# Patient Record
Sex: Female | Born: 1995 | Race: White | Hispanic: No | Marital: Single | Smoking: Former smoker
Health system: Southern US, Community
[De-identification: ages and names within clinical notes are randomized; demographics above are authoritative.]

## PROBLEM LIST (undated history)

## (undated) DIAGNOSIS — B178 Other specified acute viral hepatitis: Secondary | ICD-10-CM

## (undated) DIAGNOSIS — E559 Vitamin D deficiency, unspecified: Secondary | ICD-10-CM

## (undated) DIAGNOSIS — H18423 Band keratopathy, bilateral: Secondary | ICD-10-CM

## (undated) DIAGNOSIS — I10 Essential (primary) hypertension: Secondary | ICD-10-CM

## (undated) DIAGNOSIS — G43909 Migraine, unspecified, not intractable, without status migrainosus: Secondary | ICD-10-CM

## (undated) DIAGNOSIS — B2799 Infectious mononucleosis, unspecified with other complication: Secondary | ICD-10-CM

## (undated) DIAGNOSIS — B2790 Infectious mononucleosis, unspecified without complication: Secondary | ICD-10-CM

## (undated) DIAGNOSIS — A64 Unspecified sexually transmitted disease: Secondary | ICD-10-CM

## (undated) HISTORY — PX: WISDOM TOOTH EXTRACTION: SHX21

## (undated) HISTORY — DX: Band keratopathy, bilateral: H18.423

## (undated) HISTORY — DX: Infectious mononucleosis, unspecified with other complication: B27.99

## (undated) HISTORY — DX: Infectious mononucleosis, unspecified without complication: B27.90

## (undated) HISTORY — DX: Vitamin D deficiency, unspecified: E55.9

## (undated) HISTORY — DX: Migraine, unspecified, not intractable, without status migrainosus: G43.909

## (undated) HISTORY — DX: Other specified acute viral hepatitis: B17.8

## (undated) HISTORY — PX: OTHER SURGICAL HISTORY: SHX169

## (undated) HISTORY — DX: Unspecified sexually transmitted disease: A64

## (undated) HISTORY — PX: ANTERIOR CRUCIATE LIGAMENT REPAIR: SHX115

## (undated) HISTORY — DX: Other disorders of phosphorus metabolism: E83.39

---
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
## 2017-01-18 ENCOUNTER — Telehealth: Payer: Self-pay | Admitting: Certified Nurse Midwife

## 2017-01-18 NOTE — Telephone Encounter (Signed)
Not needed

## 2017-01-19 ENCOUNTER — Ambulatory Visit: Payer: BLUE CROSS/BLUE SHIELD | Admitting: Certified Nurse Midwife

## 2017-01-19 ENCOUNTER — Other Ambulatory Visit: Payer: Self-pay

## 2017-01-19 ENCOUNTER — Encounter: Payer: Self-pay | Admitting: Certified Nurse Midwife

## 2017-01-19 VITALS — BP 110/64 | HR 60 | Resp 16 | Ht 64.5 in | Wt 159.0 lb

## 2017-01-19 DIAGNOSIS — Z3009 Encounter for other general counseling and advice on contraception: Secondary | ICD-10-CM | POA: Diagnosis not present

## 2017-01-19 NOTE — Patient Instructions (Signed)

## 2017-01-19 NOTE — Progress Notes (Signed)
Subjective:     Patient ID: Renato GailsKelley A Rijos, female   DOB: Apr 25, 1995, 21 y.o.   MRN: [id number]  21 yo gopo here complaining of brown/red bleeding small to moderate amount with one tampon use. Used plan B Friday and started bleeding yesterday same amount. Here today to discuss contraception options. Has been using condoms for contraception and had unprotected intercourse , this was the reason for Plan B. Had IUD until 6/18 worked well and mother convinced her to, have removed due to mother's concern of how it worked. Patient's periods are regular, no issues and would like to have another IUD. Patient is in college in [place] and would like to have insertion prior to going back to school if possible. Patient does not feel as her mother does and dad agrees (per patient that IUD is OK to use). No partner change or STD concerns. Here for discussion only. No exam.     Review of Systems Pertinent to above     Objective:   Physical Exam Not done    Assessment:     Unprotected sexual activity with Plan B use with appropriate response per patient description. Contraception Kyleena IUD desired ( previous use with no issues)    Plan:     Discussed she can call for benefit information on insurance and will need to call when on period day 1-5 for insertion. She can have no unprotected intercourse prior to insertion. If concerns will not be inserted. If she does not have period needs to do UPT. If positive she needs to call. Will go ahead and have precerted if that is her plan. Patient wants to have this done. She will be called with information regarding. Questions addressed.  20 minutes in consult regarding IUD and Plan B use

## 2017-01-25 ENCOUNTER — Telehealth: Payer: Self-pay | Admitting: *Deleted

## 2017-01-25 DIAGNOSIS — Z30014 Encounter for initial prescription of intrauterine contraceptive device: Secondary | ICD-10-CM

## 2017-01-25 NOTE — Telephone Encounter (Signed)
Spoke with patient in regards to scheduling Rutha BouchardKyleena IUD insertion per Leota Sauerseborah Leonard, CNM request.   LMP 01/01/17. Took plan B on 01/13/17, has not been SA since taking Plan B. Returns back to college on 02/04/17.   Patient placed on brief hold to review scheduling with Dr. Oscar LaJertson -continue to abstain until IUD placement, UPT day of insertion, ok to schedule for 02/03/17.   Spoke with patient, IUD insertion scheduled for 02/03/17 at 9am with Dr. Oscar LaJertson. Advised as seen above. Advised to take Motrin 800 mg with food and water one hour before procedure. Will forward to our insurance and benefits department for review. Patient verbalizes understanding and is agreeable.   Routing to provider for final review. Patient is agreeable to disposition. Will close encounter.  Cc: Harland DingwallSuzy Dixon, Leota Sauerseborah Leonard, CNM

## 2017-01-31 HISTORY — PX: INTRAUTERINE DEVICE INSERTION: SHX323

## 2017-02-02 ENCOUNTER — Telehealth: Payer: Self-pay | Admitting: Obstetrics and Gynecology

## 2017-02-02 NOTE — Telephone Encounter (Signed)
Call placed to patient to review benefits for scheduled Kyleena IUD insertion.  Left voicemail message requesting a return call

## 2017-02-02 NOTE — Telephone Encounter (Signed)
Patient returned call. Patient advised of benefit information for appointment scheduled for 02/03/17, patient is agreeable. Ok to close

## 2017-02-03 ENCOUNTER — Ambulatory Visit (INDEPENDENT_AMBULATORY_CARE_PROVIDER_SITE_OTHER): Payer: BLUE CROSS/BLUE SHIELD | Admitting: Obstetrics and Gynecology

## 2017-02-03 ENCOUNTER — Ambulatory Visit: Payer: Self-pay

## 2017-02-03 ENCOUNTER — Encounter: Payer: Self-pay | Admitting: Obstetrics and Gynecology

## 2017-02-03 ENCOUNTER — Other Ambulatory Visit: Payer: Self-pay

## 2017-02-03 VITALS — BP 120/80 | HR 64 | Resp 16 | Ht 64.5 in | Wt 162.0 lb

## 2017-02-03 DIAGNOSIS — N914 Secondary oligomenorrhea: Secondary | ICD-10-CM | POA: Diagnosis not present

## 2017-02-03 DIAGNOSIS — Z30014 Encounter for initial prescription of intrauterine contraceptive device: Secondary | ICD-10-CM

## 2017-02-03 LAB — POCT URINE PREGNANCY: Preg Test, Ur: NEGATIVE

## 2017-02-03 NOTE — Progress Notes (Signed)
GYNECOLOGY  VISIT   HPI: 22 y.o.   Single  Caucasian  female   [id number] with Patient's last menstrual period was 01/01/2017 (exact date).   here for  kyleena iud insertion Irregular cycles lasting 5 days, moderated cramping, nausea & headaches. Uses tampon & maxi pads She had a liletta IUD removed in 6/18. Has been using condoms. Normal cycles from July until October, no cycle in November. LMP 01/01/17. Took plan b 12/13 or 12/14, had bleeding after that. Not sexually active since then.  She has had some hair loss, some decrease in appetite. Gained 3 lbs in the last few weeks. She has been under lots of stress particularly since the Fall.  No current sexual partner.   She is here with her mother.  GYNECOLOGIC HISTORY: Patient's last menstrual period was 01/01/2017 (exact date). Contraception: none. upt negative today Menopausal hormone therapy: none        OB History    Gravida Para Term Preterm AB Living   0 0 0 0 0 0   SAB TAB Ectopic Multiple Live Births   0 0 0 0           Patient Active Problem List   Diagnosis Date Noted  . Back strain 04/23/2015  . S/P ACL reconstruction 04/23/2015  . Non morbid obesity due to excess calories 03/27/2015  . ANA positive 01/19/2014  . Primary hypertension 11/20/2013  . Angioedema 11/18/2013  . Urticaria, chronic 11/18/2013  . DUB (dysfunctional uterine bleeding) 04/26/2012  . Hypophosphatemia   . Band keratopathy of both eyes   . Mononucleosis, infectious, with hepatitis   . Vitamin D deficiency disease   . 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 Band-shaped keratopathy 06/17/2010    Past Medical History:  Diagnosis Date  . Band keratopathy of both eyes   . Hypertension 11/20/2013  . Hypophosphatemia   . Migraines    with aura  . Mononucleosis   . Mononucleosis, infectious, with hepatitis    per patient no hepatitis  . Vitamin D deficiency disease     Past Surgical History:  Procedure Laterality Date  . ANTERIOR  CRUCIATE LIGAMENT REPAIR     2013 & 2015  . liletta iud     inserted 11-23-15  . OTHER SURGICAL HISTORY Right 2013   ACL reconstruction times 2  . WISDOM TOOTH EXTRACTION      Current Outpatient Medications  Medication Sig Dispense Refill  . acetaminophen (TYLENOL) 500 MG tablet Take 500-1,000 mg by mouth every 6 (six) hours as needed for mild pain.     . cetirizine (ZYRTEC) 10 MG tablet Take 10 mg by mouth 2 (two) times daily.     . Cholecalciferol (VITAMIN D PO) Take by mouth daily.    Marland Kitchen EPINEPHrine 0.3 mg/0.3 mL IJ SOAJ injection Inject 0.3 mLs (0.3 mg total) into the muscle once. 1 Device 0  . ibuprofen (ADVIL,MOTRIN) 800 MG tablet Take 800 mg by mouth every 8 (eight) hours as needed for moderate pain.   0  . ranitidine (ZANTAC) 150 MG tablet Take 150 mg by mouth 2 (two) times daily.  3   No current facility-administered medications for this visit.      ALLERGIES: Sulfa antibiotics and Dust mite extract  Family History  Problem Relation Age of Onset  . Diabetes Mother   . Obesity Mother   . Asthma Mother   . Thyroid disease Maternal Aunt   . Thyroid disease Maternal Grandmother   . Osteoporosis Maternal  Grandmother   . Asthma Maternal Uncle     Social History   Socioeconomic History  . Marital status: Single    Spouse name: Not on file  . Number of children: Not on file  . Years of education: Not on file  . Highest education level: Not on file  Social Needs  . Financial resource strain: Not on file  . Food insecurity - worry: Not on file  . Food insecurity - inability: Not on file  . Transportation needs - medical: Not on file  . Transportation needs - non-medical: Not on file  Occupational History  . Occupation: Student    Comment: 11th grade  Tobacco Use  . Smoking status: Never Smoker  . Smokeless tobacco: Never Used  Substance and Sexual Activity  . Alcohol use: No    Alcohol/week: 0.0 oz  . Drug use: No  . Sexual activity: Not Currently    Partners:  Male    Birth control/protection: None  Other Topics Concern  . Not on file  Social History Narrative   Lives with parents and brother. In 8th grade. Plays volleyball (in school and rec), in spanish club. Wants to go to college and be a child abuse counselor.    ROS  PHYSICAL EXAMINATION:    BP 120/80   Pulse 64   Resp 16   Ht 5' 4.5" (1.638 m)   Wt 162 lb (73.5 kg)   LMP 01/01/2017 (Exact Date)   BMI 27.38 kg/m     General appearance: alert, cooperative and appears stated age Neck: no adenopathy, supple, symmetrical, trachea midline and thyroid normal to inspection and palpation  Pelvic: External genitalia:  no lesions              Urethra:  normal appearing urethra with no masses, tenderness or lesions              Bartholins and Skenes: normal                 Vagina: normal appearing vagina with normal color and discharge, no lesions              Cervix: no cervical motion tenderness and no lesions              Bimanual Exam:  Uterus:  normal size, contour, position, consistency, mobility, non-tender and anteverted              Adnexa: no mass, fullness, tenderness                The risks of the kyleena IUD were reviewed with the patient, including infection, abnormal bleeding and uterine perfortion. Consent was signed.  A speculum was placed in the vagina, the cervix was cleansed with betadine. A tenaculum was placed on the cervix, the uterus sounded to 7 cm. The cervix was easily dilated to a 4 hagar dilator  The kyleena IUD was inserted without difficulty. The string were cut to 3-4 cm. The tenaculum was removed. Slight oozing from the tenaculum site was stopped with pressure.   The patient tolerated the procedure well.   Chaperone was present for exam.  ASSESSMENT Secondary oligomenorrhea, likely secondary to stress Contraception, kyleena IUD placed    PLAN UPT negative (not sexually active x 3 weeks) TSH, prolactin today Genprobe Use condoms for STD  protection    An After Visit Summary was printed and given to the patient.

## 2017-02-03 NOTE — Patient Instructions (Signed)
IUD Post-procedure Instructions . Cramping is common.  You may take Ibuprofen, Aleve, or Tylenol for the cramping.  This should resolve within 24 hours.   . You may have a small amount of spotting.  You should wear a mini pad for the next few days. . You may have intercourse in 24 hours. . You need to call the office if you have any pelvic pain, fever, heavy bleeding, or foul smelling vaginal discharge. . Shower or bathe as normal Must use back up contraception for one week

## 2017-02-04 LAB — GC/CHLAMYDIA PROBE AMP
Chlamydia trachomatis, NAA: NEGATIVE
NEISSERIA GONORRHOEAE BY PCR: NEGATIVE

## 2017-02-04 LAB — TSH: TSH: 2.99 u[IU]/mL (ref 0.450–4.500)

## 2017-02-04 LAB — PROLACTIN: Prolactin: 28.2 ng/mL — ABNORMAL HIGH (ref 4.8–23.3)

## 2017-02-06 ENCOUNTER — Telehealth: Payer: Self-pay | Admitting: *Deleted

## 2017-02-06 NOTE — Telephone Encounter (Signed)
-----   Message from Romualdo BolkJill Evelyn Jertson, MD sent at 02/06/2017  9:48 AM EST ----- The patient's prolactin level was minimally elevated. Please have her return this week for a repeat prolactin and a BhcG (She had a negative UPT at the time of her visit and denied sexual activity for 3 weeks). She should not have a big meal, sex or exercise prior to her repeat lab work.

## 2017-02-06 NOTE — Telephone Encounter (Signed)
Return call to Elaine. °

## 2017-02-06 NOTE — Telephone Encounter (Signed)
Patient called and scheduled her lab appointment in our office on Friday, 02/07/17, at 1:30 PM. Routing to [place] for [place].

## 2017-02-06 NOTE — Telephone Encounter (Signed)
Left message to call regarding lab results -eh 

## 2017-02-06 NOTE — Telephone Encounter (Signed)
Great - thanks

## 2017-02-06 NOTE — Telephone Encounter (Signed)
Spoke with patient and gave results. Patient is 3.5 hours away at college. She does not think she can come back anytime soon for the repeat labs. I asked patient if there was a [facility] near by and she did not know. Please advise -eh

## 2017-02-10 ENCOUNTER — Other Ambulatory Visit: Payer: Self-pay | Admitting: *Deleted

## 2017-02-10 ENCOUNTER — Encounter (HOSPITAL_BASED_OUTPATIENT_CLINIC_OR_DEPARTMENT_OTHER): Payer: Self-pay | Admitting: *Deleted

## 2017-02-10 ENCOUNTER — Emergency Department (HOSPITAL_BASED_OUTPATIENT_CLINIC_OR_DEPARTMENT_OTHER): Payer: BLUE CROSS/BLUE SHIELD

## 2017-02-10 ENCOUNTER — Emergency Department (HOSPITAL_BASED_OUTPATIENT_CLINIC_OR_DEPARTMENT_OTHER)
Admission: EM | Admit: 2017-02-10 | Discharge: 2017-02-10 | Disposition: A | Discharge status: Home / Self Care | Payer: BLUE CROSS/BLUE SHIELD | Attending: Emergency Medicine | Admitting: Emergency Medicine

## 2017-02-10 ENCOUNTER — Other Ambulatory Visit (INDEPENDENT_AMBULATORY_CARE_PROVIDER_SITE_OTHER): Payer: BLUE CROSS/BLUE SHIELD

## 2017-02-10 ENCOUNTER — Other Ambulatory Visit: Payer: Self-pay

## 2017-02-10 ENCOUNTER — Telehealth: Payer: Self-pay | Admitting: Obstetrics and Gynecology

## 2017-02-10 DIAGNOSIS — I10 Essential (primary) hypertension: Secondary | ICD-10-CM | POA: Insufficient documentation

## 2017-02-10 DIAGNOSIS — R102 Pelvic and perineal pain: Secondary | ICD-10-CM | POA: Insufficient documentation

## 2017-02-10 DIAGNOSIS — Z79899 Other long term (current) drug therapy: Secondary | ICD-10-CM | POA: Insufficient documentation

## 2017-02-10 DIAGNOSIS — R899 Unspecified abnormal finding in specimens from other organs, systems and tissues: Secondary | ICD-10-CM

## 2017-02-10 DIAGNOSIS — A64 Unspecified sexually transmitted disease: Secondary | ICD-10-CM

## 2017-02-10 HISTORY — DX: Unspecified sexually transmitted disease: A64

## 2017-02-10 LAB — WET PREP, GENITAL
Clue Cells Wet Prep HPF POC: NONE SEEN
Sperm: NONE SEEN
TRICH WET PREP: NONE SEEN
Yeast Wet Prep HPF POC: NONE SEEN

## 2017-02-10 LAB — HCG, QUANTITATIVE, PREGNANCY

## 2017-02-10 NOTE — ED Triage Notes (Signed)
She had an IUD put in on Friday. She had a blood test for BHCG today in the office because her prolactin levels were elevated on Friday and she was called to come back for further testing. He BHCG results are not back. She is having lower abdominal cramps.

## 2017-02-10 NOTE — Telephone Encounter (Signed)
Spoke with patient.  Patient states she started experiencing "pain in uterus" after lab appointment and wanted to get this checked before returning to school 3.5 hrs away. Advised patient would have been happy to discuss concerns and symptoms with provider while in office for lab appointment prior to leaving.   Patient states she was not aware she would not receive lab results until Mon or Tuesday until after she left the office today, felt ER would be appropriate for time purpose.  Advised patient labs will likely be drawn at ER, no changes made to labs drawn in office today, will update provider and return call with any additional recommendations. Return call to office with any additional questions/concerns. Patient is agreeable.    Routing to covering provider in office.   Cc: Dr. Oscar LaJertson

## 2017-02-10 NOTE — Discharge Instructions (Signed)
Your ultrasound was normal with IUD in correct position. Your pregnancy test is negative. Please follow up with your OB/GYN as needed. Take tylenol/motrin for cramping.

## 2017-02-10 NOTE — ED Provider Notes (Signed)
[facility] EMERGENCY DEPARTMENT Provider Note   CSN: [id number] Arrival date & time: 02/10/17  1451     History   Chief Complaint Chief Complaint  Patient presents with  . Abdominal Pain    HPI Katie Alvarez is a 22 y.o. female.  HPI Katie Alvarez is a 22 y.o. female presents to ED with complaint of pelvic pain. Pt states she had an IUD placed 1 week ago. States had initially some spotting and cramping which resolved.  She states today she started having sudden onset of severe cramping.  She denies any bleeding.  No nausea or vomiting.  No urinary symptoms.  She called her OB/GYN who told her to come back for some blood work, because her prolactin levels were minimally elevated on prior evaluation.  She did have negative urine hCG.  She was told to come back for recheck of prolactin levels and repeat hCG.  Patient did have this blood work done today, however results are not back yet.  Patient decided to come to emergency department. Pt states pain is "crampy." states nothing making it better or worse. No other complaints.   Past Medical History:  Diagnosis Date  . Band keratopathy of both eyes   . Hypertension 11/20/2013  . Hypophosphatemia   . Migraines    with aura  . Mononucleosis   . Mononucleosis, infectious, with hepatitis    per patient no hepatitis  . Vitamin D deficiency disease     Patient Active Problem List   Diagnosis Date Noted  . Back strain 04/23/2015  . S/P ACL reconstruction 04/23/2015  . Non morbid obesity due to excess calories 03/27/2015  . ANA positive 01/19/2014  . Primary hypertension 11/20/2013  . Angioedema 11/18/2013  . Urticaria, chronic 11/18/2013  . DUB (dysfunctional uterine bleeding) 04/26/2012  . Hypophosphatemia   . Band keratopathy of both eyes   . Mononucleosis, infectious, with hepatitis   . Vitamin D deficiency disease   . 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 Band-shaped keratopathy 06/17/2010    Past  Surgical History:  Procedure Laterality Date  . ANTERIOR CRUCIATE LIGAMENT REPAIR     2013 & 2015  . liletta iud     inserted 11-23-15  . OTHER SURGICAL HISTORY Right 2013   ACL reconstruction times 2  . WISDOM TOOTH EXTRACTION      OB History    Gravida Para Term Preterm AB Living   0 0 0 0 0 0   SAB TAB Ectopic Multiple Live Births   0 0 0 0         Home Medications    Prior to Admission medications   Medication Sig Start Date End Date Taking? Authorizing Provider  acetaminophen (TYLENOL) 500 MG tablet Take 500-1,000 mg by mouth every 6 (six) hours as needed for mild pain.     [provider]  cetirizine (ZYRTEC) 10 MG tablet Take 10 mg by mouth 2 (two) times daily.     [provider]  Cholecalciferol (VITAMIN D PO) Take by mouth daily.    [provider]  EPINEPHrine 0.3 mg/0.3 mL IJ SOAJ injection Inject 0.3 mLs (0.3 mg total) into the muscle once. 11/21/13   Rockney Ghee, MD  ibuprofen (ADVIL,MOTRIN) 800 MG tablet Take 800 mg by mouth every 8 (eight) hours as needed for moderate pain.  12/18/13   [provider]  ranitidine (ZANTAC) 150 MG tablet Take 150 mg by mouth 2 (two) times daily. 04/03/15  [provider]    Family History Family History  Problem Relation Age of Onset  . Diabetes Mother   . Obesity Mother   . Asthma Mother   . Thyroid disease Maternal Aunt   . Thyroid disease Maternal Grandmother   . Osteoporosis Maternal Grandmother   . Asthma Maternal Uncle     Social History Social History   Tobacco Use  . Smoking status: Never Smoker  . Smokeless tobacco: Never Used  Substance Use Topics  . Alcohol use: No    Alcohol/week: 0.0 oz  . Drug use: No     Allergies   Sulfa antibiotics and Dust mite extract   Review of Systems Review of Systems  Constitutional: Negative for chills and fever.  Respiratory: Negative for cough, chest tightness and shortness of breath.   Cardiovascular: Negative  for chest pain, palpitations and leg swelling.  Gastrointestinal: Positive for abdominal pain. Negative for diarrhea, nausea and vomiting.  Genitourinary: Positive for pelvic pain. Negative for dysuria, flank pain, vaginal bleeding, vaginal discharge and vaginal pain.  Musculoskeletal: Negative for arthralgias, myalgias, neck pain and neck stiffness.  Skin: Negative for rash.  Neurological: Negative for dizziness, weakness and headaches.  All other systems reviewed and are negative.    Physical Exam Updated Vital Signs BP (!) 129/91   Pulse 87   Temp 98.2 F (36.8 C) (Oral)   Resp 18   Ht 5' 4.5" (1.638 m)   Wt 73.5 kg (162 lb)   SpO2 100%   BMI 27.38 kg/m   Physical Exam  Constitutional: She appears well-developed and well-nourished. No distress.  HENT:  Head: Normocephalic.  Eyes: Conjunctivae are normal.  Neck: Neck supple.  Cardiovascular: Normal rate, regular rhythm and normal heart sounds.  Pulmonary/Chest: Effort normal and breath sounds normal. No respiratory distress. She has no wheezes. She has no rales.  Abdominal: Soft. Bowel sounds are normal. She exhibits no distension. There is tenderness in the suprapubic area. There is no rebound.  Genitourinary:  Genitourinary Comments: Normal external genitalia.  Cervix is closed, brown cervical discharge present.  Strings are visualized.  No cervical motion tenderness.  Mild uterine tenderness. No adnexal tenderness.  Musculoskeletal: She exhibits no edema.  Neurological: She is alert.  Skin: Skin is warm and dry.  Psychiatric: She has a normal mood and affect. Her behavior is normal.  Nursing note and vitals reviewed.    ED Treatments / Results  Labs (all labs ordered are listed, but only abnormal results are displayed) Labs Reviewed  WET PREP, GENITAL - Abnormal; Notable for the following components:      Result Value   WBC, Wet Prep HPF POC MANY (*)    All other components within normal limits  HCG,  QUANTITATIVE, PREGNANCY  GC/CHLAMYDIA PROBE AMP ([place]) NOT AT [facility]    EKG  EKG Interpretation None       Radiology [place] Pelvis Transvanginal Non-ob (tv Only)  Result Date: 02/10/2017 CLINICAL DATA:  Elevated prolactin, midpelvic pain today. Patient had IUD placed 1 week ago. EXAM: TRANSABDOMINAL AND TRANSVAGINAL ULTRASOUND OF PELVIS TECHNIQUE: Both transabdominal and transvaginal ultrasound examinations of the pelvis were performed. Transabdominal technique was performed for global imaging of the pelvis including uterus, ovaries, adnexal regions, and pelvic cul-de-sac. It was necessary to proceed with endovaginal exam following the transabdominal exam to visualize the uterus and ovaries. COMPARISON:  None FINDINGS: Uterus Measurements: 7.2 x 3.3 x 4.2 cm. No fibroids or other mass visualized. The both the and cyst  is identified in the cervix. Endometrium Thickness: 7 mm. IUD is identified in good position. No focal abnormality visualized. Right ovary Measurements: 3.2 x 2.2 x 2.6 cm. Normal appearance. Small follicles are noted. Left ovary Measurements: 4.3 x 2.6 x 2.3 cm. Normal appearance. Small follicles are noted. Other findings Small amount of free fluid is identified. IMPRESSION: IUD in good position.  No acute abnormality noted. Small free fluid identified in the pelvis, likely physiologic. Electronically Signed   By: Sherian Rein M.D.   On: 02/10/2017 18:40   US Pelvis Complete  Result Date: 02/10/2017 CLINICAL DATA:  Elevated prolactin, midpelvic pain today. Patient had IUD placed 1 week ago. EXAM: TRANSABDOMINAL AND TRANSVAGINAL ULTRASOUND OF PELVIS TECHNIQUE: Both transabdominal and transvaginal ultrasound examinations of the pelvis were performed. Transabdominal technique was performed for global imaging of the pelvis including uterus, ovaries, adnexal regions, and pelvic cul-de-sac. It was necessary to proceed with endovaginal exam following the transabdominal exam to visualize  the uterus and ovaries. COMPARISON:  None FINDINGS: Uterus Measurements: 7.2 x 3.3 x 4.2 cm. No fibroids or other mass visualized. The both the and cyst is identified in the cervix. Endometrium Thickness: 7 mm. IUD is identified in good position. No focal abnormality visualized. Right ovary Measurements: 3.2 x 2.2 x 2.6 cm. Normal appearance. Small follicles are noted. Left ovary Measurements: 4.3 x 2.6 x 2.3 cm. Normal appearance. Small follicles are noted. Other findings Small amount of free fluid is identified. IMPRESSION: IUD in good position.  No acute abnormality noted. Small free fluid identified in the pelvis, likely physiologic. Electronically Signed   By: Sherian Rein M.D.   On: 02/10/2017 18:40    Procedures Procedures (including critical care time)  Medications Ordered in ED Medications - No data to display   Initial Impression / Assessment and Plan / ED Course  I have reviewed the triage vital signs and the nursing notes.  Pertinent labs & imaging results that were available during my care of the patient were reviewed by me and considered in my medical decision making (see chart for details).     Pt in ED with pelvic pain. IUD placed 1 week ago. Elevated prolactin level at the time. Pt called OB./GYN, who requested preg test recheck. Will get hcg, will perform pelvic exam and [place] to verify correct placement.   HCG is negative.  Pelvic ultrasound unremarkable with normal placement of IUD.  Patient states she is currently pain-free.  At this time stable for discharge home with close outpatient follow-up.  Patient feels reassured and comfortable going home.  Vitals:   02/10/17 1500 02/10/17 1502 02/10/17 1717 02/10/17 1911  BP:  (!) 129/91 140/79   Pulse:  87 62 (!) 58  Resp:  18 18 16   Temp:  98.2 F (36.8 C)  98.5 F (36.9 C)  TempSrc:  Oral  Oral  SpO2:  100% 100% 98%  Weight: 73.5 kg (162 lb)     Height: 5' 4.5" (1.638 m)        Final Clinical Impressions(s) / ED  Diagnoses   Final diagnoses:  Pelvic pain  Pelvic cramping    ED Discharge Orders    None       Jaynie Crumble, PA-C 02/10/17 2304    Benjiman Core, MD 02/10/17 2344

## 2017-02-10 NOTE — ED Notes (Signed)
Patient is scared that she might be pregnant before she had the IUD.

## 2017-02-10 NOTE — Telephone Encounter (Signed)
Patient was seen today for labs. Patient is concerned about the possibly of being pregnant with an IUD. Patient is asking if the labs could be expedited due to needed her IUD removed if she is pregnant.

## 2017-02-10 NOTE — Telephone Encounter (Signed)
Upon chart review for patient phone call. Patient is now in ED at [facility] for abdominal cramps.  Adjusting HCG to stat order deferred. Will leave as routine.

## 2017-02-11 LAB — PROLACTIN: Prolactin: 16 ng/mL (ref 4.8–23.3)

## 2017-02-11 LAB — BETA HCG QUANT (REF LAB): hCG Quant: <1 m[IU]/mL

## 2017-02-11 LAB — GC/CHLAMYDIA PROBE AMP (~~LOC~~) NOT AT ARMC
CHLAMYDIA, DNA PROBE: POSITIVE — AB
NEISSERIA GONORRHEA: NEGATIVE

## 2017-02-13 ENCOUNTER — Other Ambulatory Visit: Payer: Self-pay

## 2017-02-13 ENCOUNTER — Ambulatory Visit (INDEPENDENT_AMBULATORY_CARE_PROVIDER_SITE_OTHER): Payer: BLUE CROSS/BLUE SHIELD | Admitting: Obstetrics and Gynecology

## 2017-02-13 ENCOUNTER — Encounter: Payer: Self-pay | Admitting: Obstetrics and Gynecology

## 2017-02-13 ENCOUNTER — Telehealth: Payer: Self-pay | Admitting: Certified Nurse Midwife

## 2017-02-13 ENCOUNTER — Telehealth: Payer: Self-pay | Admitting: *Deleted

## 2017-02-13 VITALS — BP 118/80 | HR 80 | Temp 98.2°F | Resp 16 | Wt 165.0 lb

## 2017-02-13 DIAGNOSIS — N730 Acute parametritis and pelvic cellulitis: Secondary | ICD-10-CM

## 2017-02-13 DIAGNOSIS — R102 Pelvic and perineal pain: Secondary | ICD-10-CM | POA: Diagnosis not present

## 2017-02-13 DIAGNOSIS — N898 Other specified noninflammatory disorders of vagina: Secondary | ICD-10-CM

## 2017-02-13 DIAGNOSIS — R112 Nausea with vomiting, unspecified: Secondary | ICD-10-CM

## 2017-02-13 LAB — POCT URINE PREGNANCY: PREG TEST UR: NEGATIVE

## 2017-02-13 MED ORDER — METRONIDAZOLE 500 MG PO TABS: 500.0000 mg | ORAL_TABLET | Freq: Two times a day (BID) | ORAL | 0 refills | Status: DC

## 2017-02-13 MED ORDER — CEFTRIAXONE SODIUM 250 MG IJ SOLR
250.0000 mg | Freq: Once | INTRAMUSCULAR | Status: AC
  Administered 2017-02-13: 250 mg via INTRAMUSCULAR

## 2017-02-13 MED ORDER — DOXYCYCLINE HYCLATE 100 MG PO CAPS: 100.0000 mg | ORAL_CAPSULE | Freq: Two times a day (BID) | ORAL | 0 refills | Status: DC

## 2017-02-13 MED ORDER — AZITHROMYCIN 1 G PO PACK: 1.0000 | PACK | Freq: Once | ORAL | 0 refills | Status: AC

## 2017-02-13 MED ORDER — ONDANSETRON HCL 4 MG PO TABS: 4.0000 mg | ORAL_TABLET | Freq: Three times a day (TID) | ORAL | 1 refills | Status: DC | PRN

## 2017-02-13 MED ORDER — IBUPROFEN 800 MG PO TABS: 800.0000 mg | ORAL_TABLET | Freq: Three times a day (TID) | ORAL | 1 refills | Status: DC | PRN

## 2017-02-13 NOTE — Telephone Encounter (Signed)
Spoke with patient and advised that we also sent in an RX for Flagyl. Advised to pick up Flagyl and doxycycline at [facility]. Call to [facility] to cancel Z[facility]romax. -eh

## 2017-02-13 NOTE — Telephone Encounter (Signed)
Spoke with patient, advised as seen below per Dr. Oscar LaJertson. OV scheduled for today at 4:15pm. Patient verbalizes understanding and is agreeable.   Routing to provider for final review. Patient is agreeable to disposition. Will close encounter.

## 2017-02-13 NOTE — Progress Notes (Signed)
GYNECOLOGY  VISIT   HPI: 22 y.o.   Single  Caucasian  female   G0P0000 with No LMP recorded. Patient is not currently having periods (Reason: IUD).   here c/o pelvic pain, vaginal discharge and odor X 3 days   The patient had a kyleena IUD placed on 02/03/17. She had a negative genprobe at that visit. She was then seen in the ER on 02/10/17 for pain and had a + chlamydia test, negative BhcG, negative affirm and an ultrasound that showed the IUD was in place and normal adnexa bilaterally.  In the ER she didn't have CMT, uterus was mildly tender, no adnexal tenderness.  She was having intermittent pelvic pain since the IUD was inserted. Today the pain has been constant. Up to an 8/10 in severity. The constant pain is sharp, then she is having intermittent cramping. She is having an increase in yellow/blood tinged d/c.  No fever, some nausea, had emesis earlier today, thinks it may have been from the pain. . No bowel changes.   GYNECOLOGIC HISTORY: No LMP recorded. Patient is not currently having periods (Reason: IUD). Contraception:IUD Menopausal hormone therapy: none         OB History    Gravida Para Term Preterm AB Living   0 0 0 0 0 0   SAB TAB Ectopic Multiple Live Births   0 0 0 0           Patient Active Problem List   Diagnosis Date Noted  . Back strain 04/23/2015  . S/P ACL reconstruction 04/23/2015  . Non morbid obesity due to excess calories 03/27/2015  . ANA positive 01/19/2014  . Primary hypertension 11/20/2013  . Angioedema 11/18/2013  . Urticaria, chronic 11/18/2013  . DUB (dysfunctional uterine bleeding) 04/26/2012  . Hypophosphatemia   . Band keratopathy of both eyes   . Mononucleosis, infectious, with hepatitis   . Vitamin D deficiency disease   . 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 Band-shaped keratopathy 06/17/2010    Past Medical History:  Diagnosis Date  . Band keratopathy of both eyes   . Hypertension 11/20/2013  . Hypophosphatemia   .  Migraines    with aura  . Mononucleosis   . Mononucleosis, infectious, with hepatitis    per patient no hepatitis  . STD (sexually transmitted disease) 02/10/2017   chlamydia   . Vitamin D deficiency disease     Past Surgical History:  Procedure Laterality Date  . ANTERIOR CRUCIATE LIGAMENT REPAIR     2013 & 2015  . liletta iud     inserted 11-23-15  . OTHER SURGICAL HISTORY Right 2013   ACL reconstruction times 2  . WISDOM TOOTH EXTRACTION      Current Outpatient Medications  Medication Sig Dispense Refill  . acetaminophen (TYLENOL) 500 MG tablet Take 500-1,000 mg by mouth every 6 (six) hours as needed for mild pain.     . cetirizine (ZYRTEC) 10 MG tablet Take 10 mg by mouth 2 (two) times daily.     . Cholecalciferol (VITAMIN D PO) Take by mouth daily.    Marland Kitchen. EPINEPHrine 0.3 mg/0.3 mL IJ SOAJ injection Inject 0.3 mLs (0.3 mg total) into the muscle once. 1 Device 0  . ibuprofen (ADVIL,MOTRIN) 800 MG tablet Take 800 mg by mouth every 8 (eight) hours as needed for moderate pain.   0  . ranitidine (ZANTAC) 150 MG tablet Take 150 mg by mouth 2 (two) times daily.  3  . azithromycin (ZITHROMAX) 1 g  powder Take 1 packet by mouth once for 1 dose. (Patient not taking: Reported on 02/13/2017) 1 packet 0   No current facility-administered medications for this visit.      ALLERGIES: Sulfa antibiotics and Dust mite extract  Family History  Problem Relation Age of Onset  . Diabetes Mother   . Obesity Mother   . Asthma Mother   . Thyroid disease Maternal Aunt   . Thyroid disease Maternal Grandmother   . Osteoporosis Maternal Grandmother   . Asthma Maternal Uncle     Social History   Socioeconomic History  . Marital status: Single    Spouse name: Not on file  . Number of children: Not on file  . Years of education: Not on file  . Highest education level: Not on file  Social Needs  . Financial resource strain: Not on file  . Food insecurity - worry: Not on file  . Food  insecurity - inability: Not on file  . Transportation needs - medical: Not on file  . Transportation needs - non-medical: Not on file  Occupational History  . Occupation: Student    Comment: 11th grade  Tobacco Use  . Smoking status: Never Smoker  . Smokeless tobacco: Never Used  Substance and Sexual Activity  . Alcohol use: No    Alcohol/week: 0.0 oz  . Drug use: No  . Sexual activity: Not Currently    Partners: Male    Birth control/protection: None  Other Topics Concern  . Not on file  Social History Narrative   Lives with parents and brother. In 8th grade. Plays volleyball (in school and rec), in spanish club. Wants to go to college and be a child abuse counselor.    Review of Systems  Constitutional: Negative.   HENT: Negative.   Eyes: Negative.   Respiratory: Negative.   Cardiovascular: Negative.   Gastrointestinal: Positive for nausea and vomiting.  Genitourinary:       Pelvic pain Vaginal discharge and odor   Musculoskeletal: Negative.   Skin: Negative.   Neurological: Negative.   Endo/Heme/Allergies: Negative.   Psychiatric/Behavioral: Negative.     PHYSICAL EXAMINATION:    BP 118/80 (BP Location: Right Arm, Patient Position: Sitting, Cuff Size: Normal)   Pulse 80   Resp 16   Wt 165 lb (74.8 kg)   BMI 27.88 kg/m     General appearance: alert, cooperative and appears stated age Neck: no adenopathy, supple, symmetrical, trachea midline and thyroid normal to inspection and palpation Heart: regular rate and rhythm Lungs: CTAB Abdomen: soft, non-tender; bowel sounds normal; no masses,  no organomegaly Extremities: normal, atraumatic, no cyanosis Skin: normal color, texture and turgor, no rashes or lesions Neurologic: grossly normal   Pelvic: External genitalia:  no lesions              Urethra:  normal appearing urethra with no masses, tenderness or lesions              Bartholins and Skenes: normal                 Vagina: normal appearing vagina with  normal color and discharge, no lesions              Cervix: no lesions and IUD string 4 cm. +CMT              Bimanual Exam:  Uterus:  tender, mobile, normal sized              Adnexa: tender bilaterally,  no masses               Chaperone was present for exam.  ASSESSMENT PID + chlamydia from last week (negative chlamydia the day of IUD insertion) Has an IUD, just placed on 02/03/17, in place on ultrasound on 02/10/17. Guidelines do not require removing the IUD as long as she is clinically improving.  Nausea, episode of emesis earlier today. She thinks it is from the pain.     PLAN Ceftriaxone 250 mg IM Doxycycline 100 mg BID x 2 weeks Because she had uterine instrumentation in the last 2 weeks need to add flagyl to the above antibiotics Zofran for nausea as needed Ibuprofen and tylenol for pain Discussed that if her symptoms and exam improve she doesn't need the IUD removed F/U tomorrow, may need another ultrasound Discussed that if she develops a fever or can't keep the medicine down she will need to be hospitalized   An After Visit Summary was printed and given to the patient.

## 2017-02-13 NOTE — Telephone Encounter (Signed)
Spoke with patient, advised as seen below per Dr. Oscar LaJertson. Patient declined EPT. Rx to [facility].   Patient reports yellow vaginal d/c with odor started on 1/12. Reports pelvic cramping has continued. Reviewed symptoms of chlamydia. Advised should symptoms not resolve or worsen, return call to office or seek care at local ER/Urgent care.   Denies fever/chills, N/V.   Patient declined to schedule 3 mo TOC d/t school schedule, will return call.   Advised patient Dr. Oscar LaJertson will review, I will return call with any additional recommendations. Patient is agreeable.   Routing to Dr. Oscar LaJertson.   Communicable Disease [facility], ATT: Brandy Sessoms at [phone].

## 2017-02-13 NOTE — Telephone Encounter (Signed)
I reviewed the report, she was + for chlamydia, not GC. Please treat with azithromycin 1 gram po. She needs f/u testing in 3 months. Partner needs to be treated, please offer expedited partner therapy. She should use condoms every time she is sexually active and shouldn't be sexually active until one week after she and her partner have been treated.

## 2017-02-13 NOTE — Telephone Encounter (Signed)
Patient was seen at [facility] on 02/10/2017 for pelvic pain and cramping. Patient was tested for GC/Chl and GC testing returned positive. Has not been contacted or treated for this. Saw results through MyChart. Patient had an IUD placed on 02/03/2017 in our office.   Please advise.

## 2017-02-13 NOTE — Telephone Encounter (Signed)
Please have her come in for evaluation if not here, then she needs to be seen by MD at school.

## 2017-02-13 NOTE — Telephone Encounter (Signed)
Patient was seen at [facility] over the weekend for pelvic pain.  They did STD testing and she states she checked her MyChart today and her chlamydia test came back positive.  States she has not been treated and wants to know what she needs to do now.

## 2017-02-13 NOTE — Telephone Encounter (Signed)
Patient called states she goes back to college tomorrow and wants to know what can be done.

## 2017-02-14 ENCOUNTER — Encounter (HOSPITAL_COMMUNITY): Payer: Self-pay

## 2017-02-14 ENCOUNTER — Other Ambulatory Visit: Payer: Self-pay | Admitting: *Deleted

## 2017-02-14 ENCOUNTER — Ambulatory Visit: Payer: BLUE CROSS/BLUE SHIELD | Admitting: Obstetrics and Gynecology

## 2017-02-14 ENCOUNTER — Encounter: Payer: Self-pay | Admitting: Obstetrics and Gynecology

## 2017-02-14 ENCOUNTER — Ambulatory Visit (INDEPENDENT_AMBULATORY_CARE_PROVIDER_SITE_OTHER): Payer: BLUE CROSS/BLUE SHIELD

## 2017-02-14 ENCOUNTER — Other Ambulatory Visit: Payer: Self-pay

## 2017-02-14 ENCOUNTER — Inpatient Hospital Stay (HOSPITAL_COMMUNITY)
Admission: AD | Admit: 2017-02-14 | Discharge: 2017-02-15 | DRG: 759 | Disposition: A | Discharge status: Home / Self Care | Payer: BLUE CROSS/BLUE SHIELD | Source: Ambulatory Visit | Attending: Obstetrics and Gynecology | Admitting: Obstetrics and Gynecology

## 2017-02-14 VITALS — BP 138/92 | HR 76 | Resp 16 | Wt 165.0 lb

## 2017-02-14 DIAGNOSIS — R112 Nausea with vomiting, unspecified: Secondary | ICD-10-CM

## 2017-02-14 DIAGNOSIS — N739 Female pelvic inflammatory disease, unspecified: Secondary | ICD-10-CM

## 2017-02-14 DIAGNOSIS — N730 Acute parametritis and pelvic cellulitis: Secondary | ICD-10-CM

## 2017-02-14 DIAGNOSIS — A749 Chlamydial infection, unspecified: Secondary | ICD-10-CM

## 2017-02-14 DIAGNOSIS — R102 Pelvic and perineal pain: Secondary | ICD-10-CM

## 2017-02-14 DIAGNOSIS — A5611 Chlamydial female pelvic inflammatory disease: Secondary | ICD-10-CM | POA: Diagnosis present

## 2017-02-14 DIAGNOSIS — Z23 Encounter for immunization: Secondary | ICD-10-CM | POA: Diagnosis not present

## 2017-02-14 DIAGNOSIS — Z975 Presence of (intrauterine) contraceptive device: Secondary | ICD-10-CM | POA: Diagnosis not present

## 2017-02-14 LAB — CBC WITH DIFFERENTIAL/PLATELET
BASOS ABS: 0 10*3/uL (ref 0.0–0.1)
BASOS: 0 %
Basophils Absolute: 0 10*3/uL (ref 0.0–0.2)
Basophils Relative: 0 %
EOS (ABSOLUTE): 0.1 10*3/uL (ref 0.0–0.4)
EOS ABS: 0.1 10*3/uL (ref 0.0–0.7)
EOS PCT: 1 %
EOS: 1 %
HCT: 37.4 % (ref 36.0–46.0)
HEMATOCRIT: 42.6 % (ref 34.0–46.6)
HEMOGLOBIN: 14.3 g/dL (ref 11.1–15.9)
Hemoglobin: 12.8 g/dL (ref 12.0–15.0)
Immature Grans (Abs): 0 10*3/uL (ref 0.0–0.1)
Immature Granulocytes: 0 %
LYMPHS ABS: 2.6 10*3/uL (ref 0.7–3.1)
LYMPHS PCT: 28 %
Lymphs Abs: 2.6 10*3/uL (ref 0.7–4.0)
Lymphs: 27 %
MCH: 30 pg (ref 26.6–33.0)
MCH: 30.2 pg (ref 26.0–34.0)
MCHC: 33.6 g/dL (ref 31.5–35.7)
MCHC: 34.2 g/dL (ref 30.0–36.0)
MCV: 90 fL (ref 79–97)
MCV: 88.2 fL (ref 78.0–100.0)
MONO ABS: 0.4 10*3/uL (ref 0.1–1.0)
MONOCYTES: 8 %
Monocytes Absolute: 0.8 10*3/uL (ref 0.1–0.9)
Monocytes Relative: 4 %
NEUTROS ABS: 6.4 10*3/uL (ref 1.4–7.0)
Neutro Abs: 6.4 10*3/uL (ref 1.7–7.7)
Neutrophils Relative %: 67 %
Neutrophils: 64 %
PLATELETS: 333 10*3/uL (ref 150–400)
Platelets: 425 10*3/uL — ABNORMAL HIGH (ref 150–379)
RBC: 4.76 x10E6/uL (ref 3.77–5.28)
RBC: 4.24 MIL/uL (ref 3.87–5.11)
RDW: 14.1 % (ref 12.3–15.4)
RDW: 13.6 % (ref 11.5–15.5)
WBC: 9.9 10*3/uL (ref 3.4–10.8)
WBC: 9.5 10*3/uL (ref 4.0–10.5)

## 2017-02-14 LAB — VAGINITIS/VAGINOSIS, DNA PROBE
Candida Species: NEGATIVE
Gardnerella vaginalis: NEGATIVE
TRICHOMONAS VAG: NEGATIVE

## 2017-02-14 MED ORDER — ONDANSETRON HCL 4 MG PO TABS: 4.0000 mg | ORAL_TABLET | Freq: Four times a day (QID) | ORAL | Status: DC | PRN

## 2017-02-14 MED ORDER — ONDANSETRON HCL 4 MG/2ML IJ SOLN
4.0000 mg | Freq: Four times a day (QID) | INTRAMUSCULAR | Status: DC | PRN
  Administered 2017-02-14 – 2017-02-15 (×3): 4 mg via INTRAVENOUS
  Filled 2017-02-14 (×3): qty 2

## 2017-02-14 MED ORDER — LACTATED RINGERS IV SOLN
INTRAVENOUS | Status: DC
  Administered 2017-02-14: 17:00:00 via INTRAVENOUS

## 2017-02-14 MED ORDER — DOXYCYCLINE HYCLATE 100 MG IV SOLR
100.0000 mg | Freq: Two times a day (BID) | INTRAVENOUS | Status: DC
  Administered 2017-02-14 – 2017-02-15 (×2): 100 mg via INTRAVENOUS
  Filled 2017-02-14 (×2): qty 100

## 2017-02-14 MED ORDER — DEXTROSE 5 % IV SOLN
2.0000 g | Freq: Two times a day (BID) | INTRAVENOUS | Status: DC
  Administered 2017-02-14 – 2017-02-15 (×2): 2 g via INTRAVENOUS
  Filled 2017-02-14 (×3): qty 2

## 2017-02-14 MED ORDER — SIMETHICONE 80 MG PO CHEW: 80.0000 mg | CHEWABLE_TABLET | Freq: Four times a day (QID) | ORAL | Status: DC | PRN

## 2017-02-14 MED ORDER — HYDROMORPHONE HCL 1 MG/ML IJ SOLN: 0.2000 mg | INTRAMUSCULAR | Status: DC | PRN

## 2017-02-14 MED ORDER — INFLUENZA VAC SPLIT QUAD 0.5 ML IM SUSY
0.5000 mL | PREFILLED_SYRINGE | INTRAMUSCULAR | Status: AC
  Administered 2017-02-15: 0.5 mL via INTRAMUSCULAR
  Filled 2017-02-14: qty 0.5

## 2017-02-14 MED ORDER — DOCUSATE SODIUM 100 MG PO CAPS
100.0000 mg | ORAL_CAPSULE | Freq: Two times a day (BID) | ORAL | Status: DC
  Administered 2017-02-14 – 2017-02-15 (×2): 100 mg via ORAL
  Filled 2017-02-14 (×2): qty 1

## 2017-02-14 MED ORDER — ACETAMINOPHEN 325 MG PO TABS
650.0000 mg | ORAL_TABLET | ORAL | Status: DC | PRN
  Administered 2017-02-14 – 2017-02-15 (×2): 650 mg via ORAL
  Filled 2017-02-14 (×2): qty 2

## 2017-02-14 MED ORDER — ZOLPIDEM TARTRATE 5 MG PO TABS
5.0000 mg | ORAL_TABLET | Freq: Every evening | ORAL | Status: DC | PRN
  Administered 2017-02-15: 5 mg via ORAL
  Filled 2017-02-14: qty 1

## 2017-02-14 MED ORDER — SENNOSIDES-DOCUSATE SODIUM 8.6-50 MG PO TABS: 1.0000 | ORAL_TABLET | Freq: Every evening | ORAL | Status: DC | PRN

## 2017-02-14 MED ORDER — TRAMADOL HCL 50 MG PO TABS: 50.0000 mg | ORAL_TABLET | Freq: Four times a day (QID) | ORAL | Status: DC | PRN

## 2017-02-14 MED ORDER — IBUPROFEN 800 MG PO TABS
800.0000 mg | ORAL_TABLET | Freq: Three times a day (TID) | ORAL | Status: DC | PRN
  Administered 2017-02-14 – 2017-02-15 (×2): 800 mg via ORAL
  Filled 2017-02-14 (×2): qty 1

## 2017-02-14 NOTE — Progress Notes (Signed)
GYNECOLOGY  VISIT   HPI: 22 y.o.   Single  Caucasian  female   G0P0000 with No LMP recorded. Patient is not currently having periods (Reason: IUD).   here for follow up pelvic pain. Patient c/o severe nausea and vomiting, can't tolerate her medication for PID.     Review of Systems  Constitutional: Negative.   HENT: Negative.   Eyes: Negative.   Respiratory: Negative.   Cardiovascular: Negative.   Gastrointestinal: Positive for nausea and vomiting.  Genitourinary:       Pelvic pain  Musculoskeletal: Negative.   Skin: Negative.   Neurological: Negative.   Endo/Heme/Allergies: Negative.   Psychiatric/Behavioral: Negative.     Patient sent to the hospital for admission. See full H&P.

## 2017-02-14 NOTE — H&P (Signed)
GYNECOLOGY  VISIT   HPI: 22 y.o.   Single  Caucasian  female   G0P0000 with No LMP recorded. Patient is not currently having periods (Reason: IUD).   Presented yesterday c/o pelvic pain and vaginal odor. She had a kyleena IUD placed on 02/03/17, she had a genprobe at that visit that returned as negative. She was then seen on 02/10/17 in the ER for c/o pelvic pain and had a negative BhcG, negative affirm, normal ultrasound with IUD in place. Her pelvic exam from that day was not c/w PID, no CMT, no adnexal tenderness. Her repeat genprobe from that day returned as positive for chlamydia.  Yesterday the patient presented to the office c/o worsening constant pelvic pain starting in the morning and an increase in vaginal discharge. She had just found out that her chlamydia test was positive and hadn't been started on antibiotics yet. At the time of her visit yesterday her pain was up to an 8/10 in severity. She had some nausea and an episode of emesis that she felt was related to the pain. WBC and affirm from yesterday were normal.  On exam yesterday, she had cervical motion tenderness, uterine tenderness and adnexal tenderness. She was started on antibiotics for PID and given ibuprofen for pain and Zofran for nausea. She was able to keep the medicine down last night, but this morning she vomited the antibiotics (about an hour after taking them).  Today her pain is slightly improved, it is no longer constant, but is intermittent. Feels she can't tolerate the oral antibiotics.   GYNECOLOGIC HISTORY: No LMP recorded. Patient is not currently having periods (Reason: IUD). Contraception:IUD Menopausal hormone therapy: NA        OB History    Gravida Para Term Preterm AB Living   0 0 0 0 0 0   SAB TAB Ectopic Multiple Live Births   0 0 0 0           Patient Active Problem List   Diagnosis Date Noted  . PID (acute pelvic inflammatory disease) 02/14/2017  . Back strain 04/23/2015  . S/P ACL reconstruction  04/23/2015  . Non morbid obesity due to excess calories 03/27/2015  . ANA positive 01/19/2014  . Primary hypertension 11/20/2013  . Angioedema 11/18/2013  . Urticaria, chronic 11/18/2013  . DUB (dysfunctional uterine bleeding) 04/26/2012  . Hypophosphatemia   . Band keratopathy of both eyes   . Mononucleosis, infectious, with hepatitis   . Vitamin D deficiency disease   . 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 Band-shaped keratopathy 06/17/2010    Past Medical History:  Diagnosis Date  . Band keratopathy of both eyes   . Hypertension 11/20/2013  . Hypophosphatemia   . Migraines    with aura  . Mononucleosis   . Mononucleosis, infectious, with hepatitis    per patient no hepatitis  . STD (sexually transmitted disease) 02/10/2017   chlamydia   . Vitamin D deficiency disease     Past Surgical History:  Procedure Laterality Date  . ANTERIOR CRUCIATE LIGAMENT REPAIR     2013 & 2015  . liletta iud     inserted 11-23-15  . OTHER SURGICAL HISTORY Right 2013   ACL reconstruction times 2  . WISDOM TOOTH EXTRACTION      Current Facility-Administered Medications  Medication Dose Route Frequency Provider Last Rate Last Dose  . acetaminophen (TYLENOL) tablet 650 mg  650 mg Oral Q4H PRN Romualdo Bolk, MD      .  cefoTEtan (CEFOTAN) 2 g in dextrose 5 % 50 mL IVPB  2 g Intravenous Q12H Royann Wildasin Evelyn, MD      . docusate sodium (COLACE) capsule 100 mg  100 mg Oral BID Romualdo BolkJertson, Shadana Pry Evelyn, MD      . doxycycline (VIBRAMYCIN) 100 mg in dextrose 5 % 250 mL IVPB  100 mg Intravenous Q12H Romualdo BolkJertson, Yichen Gilardi Evelyn, MD      . HYDROmorphone (DILAUDID) injection 0.2-0.6 mg  0Romualdo Bolk.2-0.6 mg Intravenous Q2H PRN Romualdo BolkJertson, Deangelo Berns Evelyn, MD      . ibuprofen (ADVIL,MOTRIN) tablet 800 mg  800 mg Oral Q8H PRN Romualdo BolkJertson, Lynzie Cliburn Evelyn, MD      . Melene Muller[START ON 02/15/2017] Influenza vac split quadrivalent PF (FLUARIX) injection 0.5 mL  0.5 mL Intramuscular Tomorrow-1000 Romualdo BolkJertson, Jazzlene Huot Evelyn, MD      .  lactated ringers infusion   Intravenous Continuous Romualdo BolkJertson, Gideon Burstein Evelyn, MD      . ondansetron [facility](ZOFRAN) tablet 4 mg  4 mg Oral Q6H PRN Romualdo BolkJertson, Diamon Reddinger Evelyn, MD       Or  . ondansetron [facility](ZOFRAN) injection 4 mg  4 mg Intravenous Q6H PRN Romualdo BolkJertson, Romolo Sieling Evelyn, MD      . senna-docusate (Senokot-S) tablet 1 tablet  1 tablet Oral QHS PRN Romualdo BolkJertson, Yuma Blucher Evelyn, MD      . simethicone [facility](MYLICON) chewable tablet 80 mg  80 mg Oral QID PRN Romualdo BolkJertson, [place] Throckmorton Evelyn, MD      . traMADol Janean Sark(ULTRAM) tablet 50 mg  50 mg Oral Q6H PRN Romualdo BolkJertson, Vennie Salsbury Evelyn, MD      . zolpidem [facility](AMBIEN) tablet 5 mg  5 mg Oral QHS PRN Romualdo BolkJertson, Tranquilino Fischler Evelyn, MD         ALLERGIES: Sulfa antibiotics and Dust mite extract  Family History  Problem Relation Age of Onset  . Diabetes Mother   . Obesity Mother   . Asthma Mother   . Thyroid disease Maternal Aunt   . Thyroid disease Maternal Grandmother   . Osteoporosis Maternal Grandmother   . Asthma Maternal Uncle     Social History   Socioeconomic History  . Marital status: Single    Spouse name: Not on file  . Number of children: Not on file  . Years of education: Not on file  . Highest education level: Not on file  Social Needs  . Financial resource strain: Not on file  . Food insecurity - worry: Not on file  . Food insecurity - inability: Not on file  . Transportation needs - medical: Not on file  . Transportation needs - non-medical: Not on file  Occupational History  . Occupation: Student    Comment: 11th grade  Tobacco Use  . Smoking status: Never Smoker  . Smokeless tobacco: Never Used  Substance and Sexual Activity  . Alcohol use: No    Alcohol/week: 0.0 oz  . Drug use: No  . Sexual activity: Not Currently    Partners: Male    Birth control/protection: None  Other Topics Concern  . Not on file  Social History Narrative   Lives with parents and brother. In 8th grade. Plays volleyball (in school and rec), in spanish club. Wants to go to college and be a child abuse  counselor.    Review of Systems  Constitutional: Negative.   HENT: Negative.   Eyes: Negative.   Respiratory: Negative.   Cardiovascular: Negative.   Gastrointestinal: Positive for nausea and vomiting.  Genitourinary: Negative.   Musculoskeletal: Negative.   Skin: Negative.   Neurological: Negative.  Endo/Heme/Allergies: Negative.   Psychiatric/Behavioral: Negative.   + pelvic pain  PHYSICAL EXAMINATION:    BP 137/90 (BP Location: Right Arm)   Pulse 66   Temp 97.8 F (36.6 C) (Oral)   Resp 16   Ht 5' 4.5" (1.638 m)   Wt 165 lb (74.8 kg)   SpO2 100%   BMI 27.88 kg/m     General appearance: alert, cooperative and appears stated age Neck: no adenopathy, supple, symmetrical, trachea midline and thyroid normal to inspection and palpation Heart: regular rate and rhythm Lungs: CTAB Abdomen: soft, non-tender; bowel sounds normal; no masses,  no organomegaly Extremities: normal, atraumatic, no cyanosis Skin: normal color, texture and turgor, no rashes or lesions Lymph: normal cervical supraclavicular and inguinal nodes Neurologic: grossly normal   Pelvic: External genitalia:  no lesions              Urethra:  normal appearing urethra with no masses, tenderness or lesions              Bartholins and Skenes: normal                 Vagina: normal appearing vagina with normal color and discharge, no lesions              Cervix: +CMT, less than yesterday              Bimanual Exam:  Uterus:  normal sized, mobile, tender              Adnexa: no masses, bilaterally tender, R>L               Ultrasound today was reviewed with the patient/ IUD in place normal adnexa bilaterally   ASSESSMENT PID, some clinical improvement, but unable to tolerate antibiotics Worsening nausea and emesis IUD in place Chlamydia    PLAN Admit for IV antibiotics Antiemetics and pain control as needed When she is discharged from the hospital, she will need to go home on doxycycline and flagyl  (secondary to recent uterine instrumentation) to complete a 2 week course. If she can't tolerate the doxycycline, azithromycin (500 mg x 1, then 250 mg a day for the next 6 days) is an option  CBC with diff   An After Visit Summary was printed and given to the patient.

## 2017-02-15 MED ORDER — SCOPOLAMINE 1 MG/3DAYS TD PT72: 1.0000 | MEDICATED_PATCH | TRANSDERMAL | 1 refills | Status: DC

## 2017-02-15 MED ORDER — SCOPOLAMINE 1 MG/3DAYS TD PT72
1.0000 | MEDICATED_PATCH | TRANSDERMAL | Status: DC
  Administered 2017-02-15: 1.5 mg via TRANSDERMAL
  Filled 2017-02-15 (×2): qty 1

## 2017-02-15 MED ORDER — DOXYCYCLINE HYCLATE 100 MG PO TABS
100.0000 mg | ORAL_TABLET | Freq: Two times a day (BID) | ORAL | Status: DC
  Administered 2017-02-15: 100 mg via ORAL
  Filled 2017-02-15 (×2): qty 1

## 2017-02-15 NOTE — Progress Notes (Signed)
Pt discharged with printed instructions. Pt verbalized an understanding. No concerns noted. Terriyah Westra L Idabelle Mcpeters, RN 

## 2017-02-15 NOTE — Discharge Summary (Signed)
    OB Discharge Summary     Patient Name: Renato GailsKelley A Macha DOB: [birth year] MRN: [id number]  Date of admission: 02/14/2017  Date of discharge: 02/15/2017  Admitting diagnosis: PID Intrauterine pregnancy: no Secondary diagnosis:  Active Problems:   PID (acute pelvic inflammatory disease)   Chlamydia   IUD (intrauterine device) in place   Nausea with vomiting      Discharge diagnosis: pid, chlamydia, IUD, nausea with vomiting                                                                                                 Hospital course: Admitted treated with IV antibiotics and antiemetics, markedly improved after 24 hours of IV antibiotics. Nausea now under control with scopolamine and zofran   Physical exam  Vitals:   02/14/17 2354 02/15/17 0338 02/15/17 0800 02/15/17 1612  BP: (!) 108/48 123/81 116/70 119/61  Pulse: (!) 56 65 63 (!) 59  Resp: 15 15 18 18   Temp: 98.2 F (36.8 C) 98 F (36.7 C) 98.1 F (36.7 C) 97.6 F (36.4 C)  TempSrc: Oral Oral Oral Oral  SpO2: 100% 100% 100% 100%  Weight:      Height:       General: Alert, no distress Pelvic: BM: mild cervical motion tenderness and diffuse pelvic tenderness. She rates her tenderness as a 4/10, down from an 8-9/10 in severity (yesterday). No longer having baseline pain.   Labs: Lab Results  Component Value Date   WBC 9.5 02/14/2017   HGB 12.8 02/14/2017   HCT 37.4 02/14/2017   MCV 88.2 02/14/2017   PLT 333 02/14/2017   CMP Latest Ref Rng & Units 04/05/2016  Glucose 70 - 99 mg/dL 73  BUN 6 - 23 mg/dL 13  Creatinine 4.090.40 - 8.111.20 mg/dL 9.140.70  Sodium 782135 - 956145 mEq/L 138  Potassium 3.5 - 5.1 mEq/L 4.3  Chloride 96 - 112 mEq/L 103  CO2 19 - 32 mEq/L 30  Calcium 8.4 - 10.5 mg/dL 9.5  Total Protein 6.0 - 8.3 g/dL 7.3  Total Bilirubin 0.2 - 1.2 mg/dL 0.3  Alkaline Phos 47 - 119 U/L 93  AST 0 - 37 U/L 14  ALT 0 - 35 U/L 12    Discharge instruction: per After Visit Summary and "Baby and Me Booklet".  After visit  meds: The patient has Doxycyline, flagyl, zofran and ibuprofen at home: Doxycycline 100 mg, 1 po BID x total of 14 days Flagyl 100 mg, 1 po BID x 14 days  Diet: regular  Activity: Advance as tolerated.   Outpatient follow OZ:HYQMup:with Dr Oscar LaJertson on Monday Follow up Appt: Future Appointments  Date Time Provider Department Center  04/03/2017  4:00 PM Romualdo BolkJertson, Alecsander Hattabaugh Evelyn, MD [facility] None   Follow up Visit:No Follow-up on file.  Contraception: IUD and condoms  Disposition:home   02/15/2017 Romualdo BolkJill Evelyn Mayda Shippee, MD

## 2017-02-15 NOTE — Progress Notes (Signed)
HD #2 admitted for PID, unable to tolerate po antibiotics  Subjective: Patient reports that she is feeling much better, pain is minimal. She is still having nausea, needed zofran during the night. No emesis.    Objective: I have reviewed patient's vital signs and labs.  CBC Latest Ref Rng & Units 02/14/2017 02/13/2017 04/05/2016  WBC 4.0 - 10.5 K/uL 9.5 9.9 6.8  Hemoglobin 12.0 - 15.0 g/dL 91.412.8 78.214.3 95.613.5  Hematocrit 36.0 - 46.0 % 37.4 42.6 41.0  Platelets 150 - 400 K/uL 333 425(H) 360.0    Today's Vitals   02/15/17 0430 02/15/17 0506 02/15/17 0605 02/15/17 0730  BP:      Pulse:      Resp:      Temp:      TempSrc:      SpO2:      Weight:      Height:      PainSc: Asleep 3  Asleep Asleep   Vitals:   02/14/17 1556 02/14/17 2020 02/14/17 2354 02/15/17 0338  BP:  123/69 (!) 108/48 123/81  Pulse:  (!) 56 (!) 56 65  Resp:  16 15 15   Temp:  98.1 F (36.7 C) 98.2 F (36.8 C) 98 F (36.7 C)  TempSrc:  Oral Oral Oral  SpO2:  100% 100% 100%  Weight: 165 lb (74.8 kg)     Height: 5' 4.5" (1.638 m)         General: alert, cooperative and no distress Resp: clear to auscultation bilaterally Cardio: S1, S2 normal GI: soft, non-tender; bowel sounds normal; no masses,  no organomegaly Extremities: extremities normal, atraumatic, no cyanosis or edema   Assessment/Plan: Admitted last pm for PID, with nausea and emesis. She was unable to keep down her antibiotics even with antiemetics. She is feeling much better this am. Her pain is almost gone. Still having nausea.  Afebrile, abdomen is NT. Pelvic exam deferred.  Plan: will try to switch to po doxycycline (next dose is tonight), continue cefotetan, continue with zofran, will add a scopolamine patch F/U later today, hope to d/c by 02/16/17 am  LOS: 1 day    Romualdo BolkJill Evelyn Jertson 02/15/2017, 7:53 AM

## 2017-02-16 ENCOUNTER — Other Ambulatory Visit: Payer: Self-pay

## 2017-02-16 ENCOUNTER — Telehealth: Payer: Self-pay | Admitting: Obstetrics and Gynecology

## 2017-02-16 NOTE — Telephone Encounter (Signed)
Pharmacy aware patient can have 30 day supply but not 90.

## 2017-02-16 NOTE — Telephone Encounter (Signed)
Medication refill request: ibuprofen 800mg  Last AEX:  04-07-16 Next AEX: not yet scheduled Last MMG (if hormonal medication request): n/a Refill authorized: [facility] requesting 90 day supply for ibuprofen. Pt was just seen for PID & sent to hospital. Please approve or deny rx.

## 2017-02-16 NOTE — Telephone Encounter (Signed)
Spoke with patient. Appointment scheduled for 02/20/2017 at 10:15 am per Dr.Jertson. Patient is feeling a lot better and has no concerns at this time.  Routing to provider for final review. Patient agreeable to disposition. Will close encounter.

## 2017-02-16 NOTE — Telephone Encounter (Signed)
Patient only needs 30 tablets.

## 2017-02-16 NOTE — Telephone Encounter (Signed)
Routing to Dr.Jertson for review and advise of scheduling patient. Only available appointment on 1/21 is 3 pm and patient will have traveled back to college.

## 2017-02-16 NOTE — Telephone Encounter (Signed)
Patient was discharged from the hospital and Dr.Jertson told her to return on Monday 02/20/17 for follow. The only appointment available is at 3:00pm. Patient needs first thing in the morning due to needing to return to school. To triage to help with scheduling.

## 2017-02-20 ENCOUNTER — Ambulatory Visit: Payer: BLUE CROSS/BLUE SHIELD | Admitting: Obstetrics and Gynecology

## 2017-02-20 ENCOUNTER — Other Ambulatory Visit: Payer: Self-pay

## 2017-02-20 ENCOUNTER — Encounter: Payer: Self-pay | Admitting: Obstetrics and Gynecology

## 2017-02-20 VITALS — BP 128/80 | HR 84 | Resp 14 | Wt 168.0 lb

## 2017-02-20 DIAGNOSIS — N730 Acute parametritis and pelvic cellulitis: Secondary | ICD-10-CM | POA: Diagnosis not present

## 2017-02-20 NOTE — Progress Notes (Signed)
GYNECOLOGY  VISIT   HPI: 22 y.o.   Single  Caucasian  female   G0P0000 with No LMP recorded. Patient is not currently having periods (Reason: IUD).   here for follow up PID. She had a kyleena IUD inserted on 02/04/16, got chlamydia the next week. She was hospitalized last week. She is on doxy and flagyl, she is using a scopolamine patch and zofran. Using ibuprofen as needed. Just occasional cramping. She is having some spotting.  Going back to school today.    GYNECOLOGIC HISTORY: No LMP recorded. Patient is not currently having periods (Reason: IUD). Contraception:IUD Menopausal hormone therapy: none         OB History    Gravida Para Term Preterm AB Living   0 0 0 0 0 0   SAB TAB Ectopic Multiple Live Births   0 0 0 0           Patient Active Problem List   Diagnosis Date Noted  . PID (acute pelvic inflammatory disease) 02/14/2017  . Chlamydia 02/14/2017  . IUD (intrauterine device) in place 02/14/2017  . Nausea with vomiting 02/14/2017  . Back strain 04/23/2015  . S/P ACL reconstruction 04/23/2015  . Non morbid obesity due to excess calories 03/27/2015  . ANA positive 01/19/2014  . Primary hypertension 11/20/2013  . Angioedema 11/18/2013  . Urticaria, chronic 11/18/2013  . DUB (dysfunctional uterine bleeding) 04/26/2012  . Hypophosphatemia   . Band keratopathy of both eyes   . Mononucleosis, infectious, with hepatitis   . Vitamin D deficiency disease   . 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 Band-shaped keratopathy 06/17/2010    Past Medical History:  Diagnosis Date  . Band keratopathy of both eyes   . Hypertension 11/20/2013  . Hypophosphatemia   . Migraines    with aura  . Mononucleosis   . Mononucleosis, infectious, with hepatitis    per patient no hepatitis  . STD (sexually transmitted disease) 02/10/2017   chlamydia   . Vitamin D deficiency disease     Past Surgical History:  Procedure Laterality Date  . ANTERIOR CRUCIATE LIGAMENT REPAIR      2013 & 2015  . liletta iud     inserted 11-23-15  . OTHER SURGICAL HISTORY Right 2013   ACL reconstruction times 2  . WISDOM TOOTH EXTRACTION      Current Outpatient Medications  Medication Sig Dispense Refill  . acetaminophen (TYLENOL) 500 MG tablet Take 500-1,000 mg by mouth every 6 (six) hours as needed for mild pain.     . cetirizine (ZYRTEC) 10 MG tablet Take 10 mg by mouth 2 (two) times daily.     . Cholecalciferol (VITAMIN D PO) Take 1 capsule by mouth daily.     Marland Kitchen. doxycycline (VIBRAMYCIN) 100 MG capsule Take 1 capsule (100 mg total) by mouth 2 (two) times daily. Take BID for 14 days.  Take with food as can cause GI distress. 28 capsule 0  . EPINEPHrine 0.3 mg/0.3 mL IJ SOAJ injection Inject 0.3 mLs (0.3 mg total) into the muscle once. 1 Device 0  . ibuprofen (ADVIL,MOTRIN) 800 MG tablet Take 1 tablet (800 mg total) by mouth every 8 (eight) hours as needed. 30 tablet 1  . metroNIDAZOLE (FLAGYL) 500 MG tablet Take 1 tablet (500 mg total) by mouth 2 (two) times daily. 28 tablet 0  . ondansetron (ZOFRAN) 4 MG tablet Take 1 tablet (4 mg total) by mouth every 8 (eight) hours as needed for nausea or vomiting. 20  tablet 1  . ranitidine (ZANTAC) 150 MG tablet Take 150 mg by mouth 2 (two) times daily.  3  . scopolamine (TRANSDERM-SCOP) 1 MG/3DAYS Place 1 patch (1.5 mg total) onto the skin every 3 (three) days. 4 patch 1   No current facility-administered medications for this visit.      ALLERGIES: Sulfa antibiotics and Dust mite extract  Family History  Problem Relation Age of Onset  . Diabetes Mother   . Obesity Mother   . Asthma Mother   . Thyroid disease Maternal Aunt   . Thyroid disease Maternal Grandmother   . Osteoporosis Maternal Grandmother   . Asthma Maternal Uncle     Social History   Socioeconomic History  . Marital status: Single    Spouse name: Not on file  . Number of children: Not on file  . Years of education: Not on file  . Highest education level: Not on  file  Social Needs  . Financial resource strain: Not on file  . Food insecurity - worry: Not on file  . Food insecurity - inability: Not on file  . Transportation needs - medical: Not on file  . Transportation needs - non-medical: Not on file  Occupational History  . Occupation: Student    Comment: 11th grade  Tobacco Use  . Smoking status: Never Smoker  . Smokeless tobacco: Never Used  Substance and Sexual Activity  . Alcohol use: No    Alcohol/week: 0.0 oz  . Drug use: No  . Sexual activity: Not Currently    Partners: Male    Birth control/protection: None  Other Topics Concern  . Not on file  Social History Narrative   Lives with parents and brother. In 8th grade. Plays volleyball (in school and rec), in spanish club. Wants to go to college and be a child abuse counselor.    Review of Systems  Constitutional: Negative.   HENT: Negative.   Eyes: Negative.   Respiratory: Negative.   Cardiovascular: Negative.   Gastrointestinal: Positive for constipation.       Bloating   Genitourinary: Negative.   Musculoskeletal: Negative.   Skin: Negative.   Neurological: Negative.   Endo/Heme/Allergies: Negative.     PHYSICAL EXAMINATION:    BP 128/80 (BP Location: Right Arm, Patient Position: Sitting, Cuff Size: Normal)   Pulse 84   Resp 14   Wt 168 lb (76.2 kg)   BMI 28.39 kg/m     General appearance: alert, cooperative and appears stated age  Pelvic: External genitalia:  no lesions              Urethra:  normal appearing urethra with no masses, tenderness or lesions              Bartholins and Skenes: normal                 Cervix: no cervical motion tenderness and IUD string felt              Bimanual Exam:  Uterus:  normal size, contour, position, consistency, mobility, non-tender and anteverted              Adnexa: no mass, fullness, tenderness                 ASSESSMENT PID s/p hospitalization, tolerating her doxy and flagyl. Feeling well. Pelvic exam is  normal  PLAN Continue antibiotics She will return for f/u in 3/19 (on spring break), will check genprobe at that visit Use condoms if  sexually active Call with any concerns   An After Visit Summary was printed and given to the patient.

## 2017-02-27 ENCOUNTER — Telehealth: Payer: Self-pay | Admitting: Obstetrics and Gynecology

## 2017-02-27 MED ORDER — FLUCONAZOLE 150 MG PO TABS: 150.0000 mg | ORAL_TABLET | Freq: Every day | ORAL | 0 refills | Status: DC

## 2017-02-27 NOTE — Telephone Encounter (Signed)
After review with Dr.Jertson rx for Diflucan 150 mg po x 1 repeat in 72 hours if symptoms persist #2 0RF sent to pharmacy on file. Patient is agreeable.  Routing to provider for final review. Patient agreeable to disposition. Will close encounter.

## 2017-02-27 NOTE — Telephone Encounter (Signed)
Spoke with patient. Patient was recently treated for Chlamydia and PID. States she is having vaginal itching, redness, swelling, and thick white discharge. Feels this is from treatment with antibiotics. Denies risk for re-exposure to STD. Patient is away at college and requesting rx for Diflucan. Advised will review with Dr.Jertson and return call.

## 2017-02-27 NOTE — Telephone Encounter (Signed)
Patient was seen last week and know thinks she may have a yeast infection. Patient is asking to talk with a nurse.

## 2017-04-03 ENCOUNTER — Ambulatory Visit: Payer: BLUE CROSS/BLUE SHIELD | Admitting: Obstetrics and Gynecology

## 2017-04-03 ENCOUNTER — Encounter: Payer: Self-pay | Admitting: Obstetrics and Gynecology

## 2017-04-03 ENCOUNTER — Other Ambulatory Visit: Payer: Self-pay

## 2017-04-03 VITALS — BP 136/80 | HR 80 | Resp 12 | Wt 170.0 lb

## 2017-04-03 DIAGNOSIS — Z113 Encounter for screening for infections with a predominantly sexual mode of transmission: Secondary | ICD-10-CM | POA: Diagnosis not present

## 2017-04-03 DIAGNOSIS — R350 Frequency of micturition: Secondary | ICD-10-CM

## 2017-04-03 DIAGNOSIS — Z30431 Encounter for routine checking of intrauterine contraceptive device: Secondary | ICD-10-CM | POA: Diagnosis not present

## 2017-04-03 DIAGNOSIS — Z8619 Personal history of other infectious and parasitic diseases: Secondary | ICD-10-CM

## 2017-04-03 DIAGNOSIS — R3915 Urgency of urination: Secondary | ICD-10-CM | POA: Diagnosis not present

## 2017-04-03 LAB — POCT URINALYSIS DIPSTICK
BILIRUBIN UA: NEGATIVE
GLUCOSE UA: NEGATIVE
KETONES UA: NEGATIVE
LEUKOCYTES UA: NEGATIVE
Nitrite, UA: NEGATIVE
Protein, UA: NEGATIVE
RBC UA: NEGATIVE
pH, UA: 5 (ref 5.0–8.0)

## 2017-04-03 NOTE — Progress Notes (Signed)
GYNECOLOGY  VISIT   HPI: 22 y.o.   Single  Caucasian  female   G0P0000 with No LMP recorded. Patient is not currently having periods (Reason: IUD).   here for IUD follow up. Kyleena placed in January, no bleeding since 03/09/17. No cramps. No dyspareunia.    She was seen at urgent care over the weekend with urinary frequency, urgency and dysuria. She was started on macrobid, urine culture came back negative today. Her burning is better, but still c/o urinary frequency and urgency. Voiding small amounts. Bladder feels heavy, bloated, no pain. No increase in vaginal d/c. No vulvar irritation.  She states that she has had these kind of bladder c/o in the past, usually resolve after a couple of days. Seems to be correlated with sex.  Has a new sexually partner, not using condoms.  She had chlamydia in January.    GYNECOLOGIC HISTORY: No LMP recorded. Patient is not currently having periods (Reason: IUD). Contraception:IUD (Kyleena) Menopausal hormone therapy: none         OB History    Gravida Para Term Preterm AB Living   0 0 0 0 0 0   SAB TAB Ectopic Multiple Live Births   0 0 0 0           Patient Active Problem List   Diagnosis Date Noted  . PID (acute pelvic inflammatory disease) 02/14/2017  . Chlamydia 02/14/2017  . IUD (intrauterine device) in place 02/14/2017  . Nausea with vomiting 02/14/2017  . Back strain 04/23/2015  . S/P ACL reconstruction 04/23/2015  . Non morbid obesity due to excess calories 03/27/2015  . ANA positive 01/19/2014  . Primary hypertension 11/20/2013  . Angioedema 11/18/2013  . Urticaria, chronic 11/18/2013  . DUB (dysfunctional uterine bleeding) 04/26/2012  . Hypophosphatemia   . Band keratopathy of both eyes   . Mononucleosis, infectious, with hepatitis   . Vitamin D deficiency disease   . 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 Band-shaped keratopathy 06/17/2010    Past Medical History:  Diagnosis Date  . Band keratopathy of both  eyes   . Hypertension 11/20/2013  . Hypophosphatemia   . Migraines    with aura  . Mononucleosis   . Mononucleosis, infectious, with hepatitis    per patient no hepatitis  . STD (sexually transmitted disease) 02/10/2017   chlamydia   . Vitamin D deficiency disease     Past Surgical History:  Procedure Laterality Date  . ANTERIOR CRUCIATE LIGAMENT REPAIR     2013 & 2015  . INTRAUTERINE DEVICE INSERTION  01/2017   Kyleena   . liletta iud     inserted 11-23-15  . OTHER SURGICAL HISTORY Right 2013   ACL reconstruction times 2  . WISDOM TOOTH EXTRACTION      Current Outpatient Medications  Medication Sig Dispense Refill  . acetaminophen (TYLENOL) 500 MG tablet Take 500-1,000 mg by mouth every 6 (six) hours as needed for mild pain.     . cetirizine (ZYRTEC) 10 MG tablet Take 10 mg by mouth 2 (two) times daily.     . Cholecalciferol (VITAMIN D PO) Take 1 capsule by mouth daily.     Marland Kitchen. EPINEPHrine 0.3 mg/0.3 mL IJ SOAJ injection Inject 0.3 mLs (0.3 mg total) into the muscle once. 1 Device 0  . ibuprofen (ADVIL,MOTRIN) 800 MG tablet Take 1 tablet (800 mg total) by mouth every 8 (eight) hours as needed. 30 tablet 1  . nitrofurantoin, macrocrystal-monohydrate, (MACROBID) 100 MG capsule Take 100  mg by mouth 2 (two) times daily.  0  . ranitidine (ZANTAC) 150 MG tablet Take 150 mg by mouth 2 (two) times daily.  3   No current facility-administered medications for this visit.      ALLERGIES: Sulfa antibiotics and Dust mite extract  Family History  Problem Relation Age of Onset  . Diabetes Mother   . Obesity Mother   . Asthma Mother   . Thyroid disease Maternal Aunt   . Thyroid disease Maternal Grandmother   . Osteoporosis Maternal Grandmother   . Asthma Maternal Uncle     Social History   Socioeconomic History  . Marital status: Single    Spouse name: Not on file  . Number of children: Not on file  . Years of education: Not on file  . Highest education level: Not on file   Social Needs  . Financial resource strain: Not on file  . Food insecurity - worry: Not on file  . Food insecurity - inability: Not on file  . Transportation needs - medical: Not on file  . Transportation needs - non-medical: Not on file  Occupational History  . Occupation: Student    Comment: 11th grade  Tobacco Use  . Smoking status: Never Smoker  . Smokeless tobacco: Never Used  Substance and Sexual Activity  . Alcohol use: No    Alcohol/week: 0.0 oz  . Drug use: No  . Sexual activity: Yes    Partners: Male    Birth control/protection: IUD  Other Topics Concern  . Not on file  Social History Narrative   Lives with parents and brother. In 8th grade. Plays volleyball (in school and rec), in spanish club. Wants to go to college and be a child abuse counselor.    Review of Systems  Constitutional: Negative.   HENT: Negative.   Eyes: Negative.   Respiratory: Negative.   Cardiovascular: Negative.   Gastrointestinal: Negative.   Genitourinary: Positive for frequency.  Musculoskeletal: Negative.   Skin: Negative.   Neurological: Negative.   Endo/Heme/Allergies: Negative.   Psychiatric/Behavioral: Negative.     PHYSICAL EXAMINATION:    BP 136/80 (BP Location: Right Arm, Patient Position: Sitting, Cuff Size: Normal)   Pulse 80   Resp 12   Wt 170 lb (77.1 kg)   BMI 28.73 kg/m     General appearance: alert, cooperative and appears stated age Abdomen: soft, non-tender; non distended, no masses,  no organomegaly CVA: not tender  Pelvic: External genitalia:  no lesions              Urethra:  normal appearing urethra with no masses, tenderness or lesions              Bartholins and Skenes: normal                 Vagina: normal appearing vagina with normal color and discharge, no lesions              Cervix: no cervical motion tenderness, no lesions and IUD string 3-4 cm              Bimanual Exam:  Uterus:  normal size, contour, position, consistency, mobility,  non-tender              Adnexa: no mass, fullness, tenderness              Bladder: not tender  Chaperone was present for exam.  ASSESSMENT IUD check, doing well H/O chlamydia/PID in January Urinary frequency/urgency, negative urine  culture at Urgent care this weekend     PLAN Genprobe sent [facility]UA for dip Recommended she stop the macrobid (negative culture) Hydrate well, if symptoms persist will refer to Urology Encouraged to use condoms every time, discussed risks of STD's   An After Visit Summary was printed and given to the patient.

## 2017-04-04 LAB — HEP, RPR, HIV PANEL
HEP B S AG: NEGATIVE
HIV Screen 4th Generation wRfx: NONREACTIVE
RPR: NONREACTIVE

## 2017-04-04 LAB — GC/CHLAMYDIA PROBE AMP
Chlamydia trachomatis, NAA: NEGATIVE
NEISSERIA GONORRHOEAE BY PCR: NEGATIVE

## 2017-04-04 LAB — HEPATITIS C ANTIBODY

## 2017-06-20 ENCOUNTER — Encounter: Payer: Self-pay | Admitting: Adult Health

## 2017-06-20 ENCOUNTER — Telehealth: Payer: Self-pay | Admitting: Adult Health

## 2017-06-20 ENCOUNTER — Ambulatory Visit: Payer: BLUE CROSS/BLUE SHIELD | Admitting: Adult Health

## 2017-06-20 VITALS — BP 118/70 | Temp 98.1°F | Wt 176.0 lb

## 2017-06-20 DIAGNOSIS — F32A Depression, unspecified: Secondary | ICD-10-CM

## 2017-06-20 DIAGNOSIS — F329 Major depressive disorder, single episode, unspecified: Secondary | ICD-10-CM | POA: Diagnosis not present

## 2017-06-20 DIAGNOSIS — F419 Anxiety disorder, unspecified: Secondary | ICD-10-CM

## 2017-06-20 MED ORDER — CITALOPRAM HYDROBROMIDE 10 MG PO TABS: 10.0000 mg | ORAL_TABLET | Freq: Every day | ORAL | 1 refills | Status: DC

## 2017-06-20 NOTE — Telephone Encounter (Signed)
We can refer her to our behavioral health  If Katie Alvarez is not to far away for her she can call Katie Alvarez -  [phone] - she should not need a referral for this person and she can probably get her in sooner than [facility] can

## 2017-06-20 NOTE — Telephone Encounter (Signed)
Recommendations?
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
## 2017-06-20 NOTE — Telephone Encounter (Signed)
Copied from CRM [id number]. Topic: Quick Communication - See Telephone Encounter >> Jun 20, 2017  4:42 PM Lorrine Kin, NT wrote: CRM for notification. See Telephone encounter for: 06/20/17. Patient calling and states that she needs a referral for a local therapist for the summer. States that she is currently seeing someone where they go to school. States that she needs a therapist for the summer for the [place] or [place] area. Please advise. CB#: [phone]

## 2017-06-21 NOTE — Telephone Encounter (Signed)
Spoke to the pt.  She has opted for a referral to one of our [place] locations.  Referral placed.

## 2017-07-05 ENCOUNTER — Telehealth: Payer: Self-pay | Admitting: Adult Health

## 2017-07-05 NOTE — Telephone Encounter (Signed)
Copied from CRM [phone]. Topic: Quick Communication - See Telephone Encounter >> Jun 20, 2017  4:42 PM Lorrine KinMcGee, Demi B, NT wrote: CRM for notification. See Telephone encounter for: 06/20/17. Patient calling and states that she needs a referral for a local therapist for the summer. States that she is currently seeing someone where they go to school. [place]. Please advise. CB#: [phone] >> Jul 05, 2017 10:34 AM Rudi CocoLathan, Alonte Wulff M, NT wrote: Pt. Calling to check on the status of referral said it has been two weeks and she still hasn't heard anything as of yet.

## 2017-07-05 NOTE — Telephone Encounter (Signed)
Spoke to the pt.  Gave her some telephone number and names of psychiatric centers.  [facility]  [phone] [facility]  [phone] [facility]  [phone]  Pt will call to schedule.

## 2017-07-12 ENCOUNTER — Other Ambulatory Visit: Payer: Self-pay | Admitting: Adult Health

## 2017-07-12 DIAGNOSIS — F329 Major depressive disorder, single episode, unspecified: Secondary | ICD-10-CM

## 2017-07-12 DIAGNOSIS — F419 Anxiety disorder, unspecified: Principal | ICD-10-CM

## 2017-07-12 DIAGNOSIS — F32A Depression, unspecified: Secondary | ICD-10-CM

## 2017-07-12 NOTE — Telephone Encounter (Signed)
Filled on 06/20/17 for 2 months.  Message sent to the pharmacy.

## 2017-07-21 ENCOUNTER — Ambulatory Visit: Payer: BLUE CROSS/BLUE SHIELD | Admitting: Adult Health

## 2017-07-21 ENCOUNTER — Encounter: Payer: Self-pay | Admitting: Adult Health

## 2017-07-21 VITALS — BP 130/80 | Temp 98.5°F | Wt 173.0 lb

## 2017-07-21 DIAGNOSIS — F419 Anxiety disorder, unspecified: Secondary | ICD-10-CM

## 2017-07-21 DIAGNOSIS — L309 Dermatitis, unspecified: Secondary | ICD-10-CM | POA: Diagnosis not present

## 2017-07-21 DIAGNOSIS — F329 Major depressive disorder, single episode, unspecified: Secondary | ICD-10-CM

## 2017-07-21 DIAGNOSIS — F32A Depression, unspecified: Secondary | ICD-10-CM

## 2017-07-21 MED ORDER — CITALOPRAM HYDROBROMIDE 10 MG PO TABS: 10.0000 mg | ORAL_TABLET | Freq: Every day | ORAL | 1 refills | Status: DC

## 2017-07-21 MED ORDER — TRIAMCINOLONE ACETONIDE 0.1 % EX CREA: 1.0000 "application " | TOPICAL_CREAM | Freq: Two times a day (BID) | CUTANEOUS | 0 refills | Status: DC
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
## 2017-07-26 ENCOUNTER — Emergency Department (HOSPITAL_BASED_OUTPATIENT_CLINIC_OR_DEPARTMENT_OTHER)
Admission: EM | Admit: 2017-07-26 | Discharge: 2017-07-26 | Disposition: A | Discharge status: Home / Self Care | Payer: BLUE CROSS/BLUE SHIELD | Attending: Emergency Medicine | Admitting: Emergency Medicine

## 2017-07-26 ENCOUNTER — Encounter (HOSPITAL_BASED_OUTPATIENT_CLINIC_OR_DEPARTMENT_OTHER): Payer: Self-pay | Admitting: *Deleted

## 2017-07-26 ENCOUNTER — Other Ambulatory Visit: Payer: Self-pay

## 2017-07-26 DIAGNOSIS — G43C0 Periodic headache syndromes in child or adult, not intractable: Secondary | ICD-10-CM

## 2017-07-26 DIAGNOSIS — Z79899 Other long term (current) drug therapy: Secondary | ICD-10-CM | POA: Insufficient documentation

## 2017-07-26 DIAGNOSIS — I10 Essential (primary) hypertension: Secondary | ICD-10-CM | POA: Insufficient documentation

## 2017-07-26 DIAGNOSIS — R51 Headache: Secondary | ICD-10-CM | POA: Diagnosis present

## 2017-07-26 MED ORDER — DEXAMETHASONE 6 MG PO TABS
10.0000 mg | ORAL_TABLET | Freq: Once | ORAL | Status: AC
  Administered 2017-07-26: 10 mg via ORAL
  Filled 2017-07-26: qty 1

## 2017-07-26 MED ORDER — DEXAMETHASONE 4 MG PO TABS
ORAL_TABLET | ORAL | Status: AC
  Filled 2017-07-26: qty 1

## 2017-07-26 MED ORDER — DIPHENHYDRAMINE HCL 50 MG/ML IJ SOLN
25.0000 mg | Freq: Once | INTRAMUSCULAR | Status: AC
  Administered 2017-07-26: 25 mg via INTRAMUSCULAR
  Filled 2017-07-26: qty 1

## 2017-07-26 MED ORDER — PROCHLORPERAZINE EDISYLATE 10 MG/2ML IJ SOLN
10.0000 mg | Freq: Once | INTRAMUSCULAR | Status: AC
  Administered 2017-07-26: 10 mg via INTRAMUSCULAR
  Filled 2017-07-26: qty 2

## 2017-07-26 NOTE — ED Triage Notes (Signed)
migraine x 3 days

## 2017-07-26 NOTE — ED Notes (Signed)
ED Provider at bedside. 

## 2017-07-26 NOTE — ED Provider Notes (Signed)
[facility] EMERGENCY DEPARTMENT Provider Note   CSN: [id number] Arrival date & time: 07/26/17  1528     History   Chief Complaint Chief Complaint  Patient presents with  . Migraine    HPI Katie Alvarez is a 22 y.o. female.  22 yo F with a chief complaint of a headache.  This been going on for the past 3 days.  Described as behind the left eye and then slowly worsening.  Describes it as a throbbing pain.  Worse with bright lights and loud noises.  Has been nauseated and vomited.  Denies fevers or chills denies neck pain denies trauma.  Denies unilateral numbness or weakness denies difficulty with speech or swallowing.  Has a history of headaches like this in the past.  Usually go away when she goes to sleep and this 1 had improved somewhat and then reoccurred this morning.  The history is provided by the patient.  Migraine  This is a new problem. The current episode started 2 days ago. The problem occurs constantly. The problem has been gradually worsening. Associated symptoms include headaches. Pertinent negatives include no chest pain and no shortness of breath. Exacerbated by: Bright lights and loud noises. Nothing relieves the symptoms. She has tried nothing for the symptoms. The treatment provided no relief.    Past Medical History:  Diagnosis Date  . Band keratopathy of both eyes   . Hypertension 11/20/2013  . Hypophosphatemia   . Migraines    with aura  . Mononucleosis   . Mononucleosis, infectious, with hepatitis    per patient no hepatitis  . STD (sexually transmitted disease) 02/10/2017   chlamydia   . Vitamin D deficiency disease     Patient Active Problem List   Diagnosis Date Noted  . PID (acute pelvic inflammatory disease) 02/14/2017  . Chlamydia 02/14/2017  . IUD (intrauterine device) in place 02/14/2017  . Nausea with vomiting 02/14/2017  . Back strain 04/23/2015  . S/P ACL reconstruction 04/23/2015  . Non morbid obesity due to excess  calories 03/27/2015  . ANA positive 01/19/2014  . Primary hypertension 11/20/2013  . Angioedema 11/18/2013  . Urticaria, chronic 11/18/2013  . DUB (dysfunctional uterine bleeding) 04/26/2012  . Hypophosphatemia   . Band keratopathy of both eyes   . Mononucleosis, infectious, with hepatitis   . Vitamin D deficiency disease   . 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 Band-shaped keratopathy 06/17/2010    Past Surgical History:  Procedure Laterality Date  . ANTERIOR CRUCIATE LIGAMENT REPAIR     2013 & 2015  . INTRAUTERINE DEVICE INSERTION  01/2017   Kyleena   . liletta iud     inserted 11-23-15  . OTHER SURGICAL HISTORY Right 2013   ACL reconstruction times 2  . WISDOM TOOTH EXTRACTION       OB History    Gravida  0   Para  0   Term  0   Preterm  0   AB  0   Living  0     SAB  0   TAB  0   Ectopic  0   Multiple  0   Live Births               Home Medications    Prior to Admission medications   Medication Sig Start Date End Date Taking? Authorizing Provider  acetaminophen (TYLENOL) 500 MG tablet Take 500-1,000 mg by mouth every 6 (six) hours as needed for mild pain.  [provider]  cetirizine (ZYRTEC) 10 MG tablet Take 10 mg by mouth 2 (two) times daily.     [provider]  Cholecalciferol (VITAMIN D PO) Take 1 capsule by mouth daily.     [provider]  citalopram (CELEXA) 10 MG tablet Take 1 tablet (10 mg total) by mouth daily. 07/21/17   Nafziger, Kandee Keen, NP  EPINEPHrine 0.3 mg/0.3 mL IJ SOAJ injection Inject 0.3 mLs (0.3 mg total) into the muscle once. 11/21/13   Rockney Ghee, MD  ibuprofen (ADVIL,MOTRIN) 800 MG tablet Take 1 tablet (800 mg total) by mouth every 8 (eight) hours as needed. 02/13/17   Romualdo Bolk, MD  Levonorgestrel [facility]) 19.5 MG IUD by Intrauterine route.    [provider]  ranitidine (ZANTAC) 150 MG tablet Take 150 mg by mouth 2 (two) times daily. 04/03/15   [provider]  triamcinolone cream (KENALOG) 0.1 % Apply 1 application topically 2 (two) times daily. 07/21/17   Shirline Frees, NP    Family History Family History  Problem Relation Age of Onset  . Diabetes Mother   . Obesity Mother   . Asthma Mother   . Thyroid disease Maternal Aunt   . Thyroid disease Maternal Grandmother   . Osteoporosis Maternal Grandmother   . Asthma Maternal Uncle     Social History Social History   Tobacco Use  . Smoking status: Never Smoker  . Smokeless tobacco: Never Used  Substance Use Topics  . Alcohol use: No    Alcohol/week: 0.0 oz  . Drug use: No     Allergies   Sulfa antibiotics and Dust mite extract   Review of Systems Review of Systems  Constitutional: Negative for chills and fever.  HENT: Negative for congestion and rhinorrhea.   Eyes: Negative for redness and visual disturbance.  Respiratory: Negative for shortness of breath and wheezing.   Cardiovascular: Negative for chest pain and palpitations.  Gastrointestinal: Negative for nausea and vomiting.  Genitourinary: Negative for dysuria and urgency.  Musculoskeletal: Negative for arthralgias and myalgias.  Skin: Negative for pallor and wound.  Neurological: Positive for headaches. Negative for dizziness.     Physical Exam Updated Vital Signs BP 132/88 (BP Location: Right Arm)   Pulse 67   Temp 98.7 F (37.1 C) (Oral)   Resp 16   Ht 5\' 4"  (1.626 m)   Wt 79.4 kg (175 lb)   SpO2 100%   BMI 30.04 kg/m   Physical Exam  Constitutional: She is oriented to person, place, and time. She appears well-developed and well-nourished. No distress.  HENT:  Head: Normocephalic and atraumatic.  Eyes: Pupils are equal, round, and reactive to light. EOM are normal.  Neck: Normal range of motion. Neck supple.  Cardiovascular: Normal rate and regular rhythm. Exam reveals no gallop and no friction rub.  No murmur heard. Pulmonary/Chest: Effort normal. She has no wheezes. She has no  rales.  Abdominal: Soft. She exhibits no distension and no mass. There is no tenderness. There is no guarding.  Musculoskeletal: She exhibits no edema or tenderness.  Neurological: She is alert and oriented to person, place, and time. She has normal strength. No cranial nerve deficit or sensory deficit. She displays a negative Romberg sign. Coordination and gait normal.  Skin: Skin is warm and dry. She is not diaphoretic.  Psychiatric: She has a normal mood and affect. Her behavior is normal.  Nursing note and vitals reviewed.    ED Treatments / Results  Labs (all  labs ordered are listed, but only abnormal results are displayed) Labs Reviewed - No data to display  EKG None  Radiology No results found.  Procedures Procedures (including critical care time)  Medications Ordered in ED Medications  dexamethasone (DECADRON) 4 MG tablet (  Not Given 07/26/17 1741)  prochlorperazine (COMPAZINE) injection 10 mg (10 mg Intramuscular Given 07/26/17 1612)  diphenhydrAMINE (BENADRYL) injection 25 mg (25 mg Intramuscular Given 07/26/17 1611)  dexamethasone (DECADRON) tablet 10 mg (10 mg Oral Given 07/26/17 1609)     Initial Impression / Assessment and Plan / ED Course  I have reviewed the triage vital signs and the nursing notes.  Pertinent labs & imaging results that were available during my care of the patient were reviewed by me and considered in my medical decision making (see chart for details).     22 yo F with a chief complaint of a headache.  Similar to ones she has had in the past.  Benign neurologic exam.  Will give a headache cocktail and reassess.  Feeling much better.  D/c home.   7:22 PM:  I have discussed the diagnosis/risks/treatment options with the patient and family and believe the pt to be eligible for discharge home to follow-up with PCP, neuro. We also discussed returning to the ED immediately if new or worsening sx occur. We discussed the sx which are most concerning  (e.g., sudden worsening pain, fever, inability to tolerate by mouth) that necessitate immediate return. Medications administered to the patient during their visit and any new prescriptions provided to the patient are listed below.  Medications given during this visit Medications  dexamethasone (DECADRON) 4 MG tablet (  Not Given 07/26/17 1741)  prochlorperazine (COMPAZINE) injection 10 mg (10 mg Intramuscular Given 07/26/17 1612)  diphenhydrAMINE (BENADRYL) injection 25 mg (25 mg Intramuscular Given 07/26/17 1611)  dexamethasone (DECADRON) tablet 10 mg (10 mg Oral Given 07/26/17 1609)      The patient appears reasonably screen and/or stabilized for discharge and I doubt any other medical condition or other [facility] requiring further screening, evaluation, or treatment in the ED at this time prior to discharge.    Final Clinical Impressions(s) / ED Diagnoses   Final diagnoses:  Periodic headache syndrome, not intractable    ED Discharge Orders        Ordered    Ambulatory referral to Neurology     07/26/17 1719       Melene PlanFloyd, Graysen Depaula, DO 07/26/17 1922

## 2017-07-27 ENCOUNTER — Encounter: Payer: Self-pay | Admitting: Neurology

## 2017-09-14 ENCOUNTER — Encounter: Payer: Self-pay | Admitting: Obstetrics and Gynecology

## 2017-10-11 ENCOUNTER — Telehealth: Payer: Self-pay | Admitting: Adult Health

## 2017-10-11 MED ORDER — CITALOPRAM HYDROBROMIDE 20 MG PO TABS: 20.0000 mg | ORAL_TABLET | Freq: Every day | ORAL | 0 refills | Status: DC

## 2017-10-11 NOTE — Telephone Encounter (Signed)
Copied from CRM [id number]. Topic: General - Other >> Oct 11, 2017  9:27 AM Stephannie Li, NT wrote: Reason for CRM: Patient called and said the school athletics doctor wants to increase citalopram (CELEXA) 10 MG tablet to 20 mg and he also told her to take 5 mg of melatonin at night  as well as  200 mg of  riboflavin and she wanted to know this is  ok please , please call her at [phone] to discuss ,she wants to know if this ok if Kandee Keen can prescribe it for her instead of the doctor on campus , please send it to [id number] [place], [place] - [address] [phone] (Phone) [phone] (Fax)

## 2017-10-11 NOTE — Telephone Encounter (Signed)
I am fine with increasing Celexa to 20 mg.   She is fine taking Melatonin and Riboflavin - OTC

## 2017-10-11 NOTE — Telephone Encounter (Signed)
Sent to the pharmacy by e-scribe.  Pt notified that Katie Alvarez is fine with increasing the Celexa and he is fine with her taking melatonin and riboflavin.  No further action required.

## 2017-12-11 ENCOUNTER — Other Ambulatory Visit: Payer: Self-pay | Admitting: Adult Health

## 2017-12-11 DIAGNOSIS — F329 Major depressive disorder, single episode, unspecified: Secondary | ICD-10-CM

## 2017-12-11 DIAGNOSIS — F32A Depression, unspecified: Secondary | ICD-10-CM

## 2017-12-11 DIAGNOSIS — F419 Anxiety disorder, unspecified: Principal | ICD-10-CM

## 2017-12-12 NOTE — Telephone Encounter (Signed)
Denied.  Pt is no longer taking 10 mg.  Strength has been increased to 20 mg.

## 2017-12-23 ENCOUNTER — Other Ambulatory Visit: Payer: Self-pay | Admitting: Adult Health

## 2017-12-26 NOTE — Telephone Encounter (Signed)
Does pt need follow up?

## 2018-01-25 ENCOUNTER — Ambulatory Visit: Payer: BLUE CROSS/BLUE SHIELD | Admitting: Adult Health

## 2018-01-25 ENCOUNTER — Encounter: Payer: Self-pay | Admitting: Adult Health

## 2018-01-25 VITALS — BP 120/80 | HR 87 | Temp 98.0°F | Wt 194.0 lb

## 2018-01-25 DIAGNOSIS — F329 Major depressive disorder, single episode, unspecified: Secondary | ICD-10-CM

## 2018-01-25 DIAGNOSIS — F419 Anxiety disorder, unspecified: Secondary | ICD-10-CM

## 2018-01-25 DIAGNOSIS — F32A Depression, unspecified: Secondary | ICD-10-CM

## 2018-01-25 DIAGNOSIS — T753XXA Motion sickness, initial encounter: Secondary | ICD-10-CM | POA: Diagnosis not present

## 2018-01-25 MED ORDER — SCOPOLAMINE 1 MG/3DAYS TD PT72: 1.0000 | MEDICATED_PATCH | TRANSDERMAL | 0 refills | Status: DC
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
## 2018-01-27 ENCOUNTER — Other Ambulatory Visit: Payer: Self-pay

## 2018-01-27 ENCOUNTER — Emergency Department (HOSPITAL_BASED_OUTPATIENT_CLINIC_OR_DEPARTMENT_OTHER)
Admission: EM | Admit: 2018-01-27 | Discharge: 2018-01-27 | Disposition: A | Discharge status: Home / Self Care | Payer: BLUE CROSS/BLUE SHIELD | Attending: Emergency Medicine | Admitting: Emergency Medicine

## 2018-01-27 ENCOUNTER — Encounter (HOSPITAL_BASED_OUTPATIENT_CLINIC_OR_DEPARTMENT_OTHER): Payer: Self-pay | Admitting: *Deleted

## 2018-01-27 DIAGNOSIS — Z79899 Other long term (current) drug therapy: Secondary | ICD-10-CM | POA: Diagnosis not present

## 2018-01-27 DIAGNOSIS — N390 Urinary tract infection, site not specified: Secondary | ICD-10-CM | POA: Diagnosis not present

## 2018-01-27 DIAGNOSIS — R350 Frequency of micturition: Secondary | ICD-10-CM | POA: Diagnosis present

## 2018-01-27 DIAGNOSIS — I10 Essential (primary) hypertension: Secondary | ICD-10-CM | POA: Diagnosis not present

## 2018-01-27 LAB — URINALYSIS, ROUTINE W REFLEX MICROSCOPIC
BILIRUBIN URINE: NEGATIVE
Glucose, UA: 100 mg/dL — AB
Ketones, ur: NEGATIVE mg/dL
Nitrite: POSITIVE — AB
Protein, ur: 100 mg/dL — AB
Specific Gravity, Urine: 1.015 (ref 1.005–1.030)
pH: 7.5 (ref 5.0–8.0)

## 2018-01-27 LAB — URINALYSIS, MICROSCOPIC (REFLEX): WBC, UA: >50 WBC/hpf (ref 0–5)

## 2018-01-27 LAB — PREGNANCY, URINE: Preg Test, Ur: NEGATIVE

## 2018-01-27 MED ORDER — CEPHALEXIN 250 MG PO CAPS
500.0000 mg | ORAL_CAPSULE | Freq: Once | ORAL | Status: AC
  Administered 2018-01-27: 500 mg via ORAL
  Filled 2018-01-27: qty 2

## 2018-01-27 MED ORDER — CEPHALEXIN 250 MG PO CAPS
ORAL_CAPSULE | ORAL | Status: AC
  Filled 2018-01-27: qty 2

## 2018-01-27 MED ORDER — CEPHALEXIN 500 MG PO CAPS: 500.0000 mg | ORAL_CAPSULE | Freq: Two times a day (BID) | ORAL | 0 refills | Status: DC

## 2018-01-27 NOTE — ED Notes (Addendum)
Pt reports having symptoms consistent with UTI's; frequent urge to urinate, painful urination, and abd pain. Pt states she gets UTI's quite frequently and these symptoms are consistent with her past experiences. Pt reports calling her PCP for a prescription but they advised her to come into the ED instead.

## 2018-01-27 NOTE — ED Provider Notes (Signed)
[facility] EMERGENCY DEPARTMENT Provider Note   CSN: [id number] Arrival date & time: 01/27/18  1729     History   Chief Complaint Chief Complaint  Patient presents with  . Urinary Tract Infection    HPI Katie Alvarez is a 22 y.o. female.  The history is provided by the patient. No language interpreter was used.  Urinary Tract Infection     Katie Alvarez is a 22 y.o. female who presents to the Emergency Department complaining of uti. Presents to the emergency department for evaluation of 24 hours of urinary frequency, dysuria and pressure. She has a history of recurrent UTIs, last one was six months ago. She denies any fevers, nausea, vomiting, abdominal pain, vaginal discharge. No new sexual partners. She did have some mild low back pain earlier, none currently. She has been taking azo at home. She has a kylena IUD. Past Medical History:  Diagnosis Date  . Band keratopathy of both eyes   . Hypertension 11/20/2013  . Hypophosphatemia   . Migraines    with aura  . Mononucleosis   . Mononucleosis, infectious, with hepatitis    per patient no hepatitis  . STD (sexually transmitted disease) 02/10/2017   chlamydia   . Vitamin D deficiency disease     Patient Active Problem List   Diagnosis Date Noted  . PID (acute pelvic inflammatory disease) 02/14/2017  . Chlamydia 02/14/2017  . IUD (intrauterine device) in place 02/14/2017  . Nausea with vomiting 02/14/2017  . Back strain 04/23/2015  . S/P ACL reconstruction 04/23/2015  . Non morbid obesity due to excess calories 03/27/2015  . ANA positive 01/19/2014  . Primary hypertension 11/20/2013  . Angioedema 11/18/2013  . Urticaria, chronic 11/18/2013  . DUB (dysfunctional uterine bleeding) 04/26/2012  . Hypophosphatemia   . Band keratopathy of both eyes   . Mononucleosis, infectious, with hepatitis   . Vitamin D deficiency disease   . 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 Band-shaped keratopathy  06/17/2010    Past Surgical History:  Procedure Laterality Date  . ANTERIOR CRUCIATE LIGAMENT REPAIR     2013 & 2015  . INTRAUTERINE DEVICE INSERTION  01/2017   Kyleena   . liletta iud     inserted 11-23-15  . OTHER SURGICAL HISTORY Right 2013   ACL reconstruction times 2  . WISDOM TOOTH EXTRACTION       OB History    Gravida  0   Para  0   Term  0   Preterm  0   AB  0   Living  0     SAB  0   TAB  0   Ectopic  0   Multiple  0   Live Births               Home Medications    Prior to Admission medications   Medication Sig Start Date End Date Taking? Authorizing Provider  acetaminophen (TYLENOL) 500 MG tablet Take 500-1,000 mg by mouth every 6 (six) hours as needed for mild pain.     [provider]  cetirizine (ZYRTEC) 10 MG tablet Take 10 mg by mouth 2 (two) times daily.     [provider]  Cholecalciferol (VITAMIN D PO) Take 1 capsule by mouth daily.     [provider]  citalopram (CELEXA) 20 MG tablet TAKE 1 TABLET BY MOUTH EVERY DAY 12/26/17   Nafziger, Kandee Keenory, NP  EPINEPHrine 0.3 mg/0.3 mL IJ SOAJ injection Inject 0.3  mLs (0.3 mg total) into the muscle once. 11/21/13   Rockney Ghee, MD  ibuprofen (ADVIL,MOTRIN) 800 MG tablet Take 1 tablet (800 mg total) by mouth every 8 (eight) hours as needed. 02/13/17   Romualdo Bolk, MD  Levonorgestrel [facility]) 19.5 MG IUD by Intrauterine route.    [provider]  ranitidine (ZANTAC) 150 MG tablet Take 150 mg by mouth 2 (two) times daily. 04/03/15   [provider]  scopolamine (TRANSDERM-SCOP) 1 MG/3DAYS Place 1 patch (1.5 mg total) onto the skin every 3 (three) days. 01/25/18   Nafziger, Kandee Keen, NP  triamcinolone cream (KENALOG) 0.1 % Apply 1 application topically 2 (two) times daily. 07/21/17   Shirline Frees, NP    Family History Family History  Problem Relation Age of Onset  . Diabetes Mother   . Obesity Mother   . Asthma Mother   . Thyroid disease  Maternal Aunt   . Thyroid disease Maternal Grandmother   . Osteoporosis Maternal Grandmother   . Asthma Maternal Uncle     Social History Social History   Tobacco Use  . Smoking status: Never Smoker  . Smokeless tobacco: Never Used  Substance Use Topics  . Alcohol use: No    Alcohol/week: 0.0 standard drinks  . Drug use: No     Allergies   Sulfa antibiotics and Dust mite extract   Review of Systems Review of Systems  All other systems reviewed and are negative.    Physical Exam Updated Vital Signs BP 123/84 (BP Location: Left Arm)   Pulse 91   Temp 98.6 F (37 C) (Oral)   Resp 18   Ht 5\' 4"  (1.626 m)   Wt 88 kg   SpO2 100%   BMI 33.30 kg/m   Physical Exam Vitals signs and nursing note reviewed.  Constitutional:      Appearance: Normal appearance.  HENT:     Head: Normocephalic and atraumatic.     Mouth/Throat:     Mouth: Mucous membranes are moist.  Neck:     Musculoskeletal: Neck supple.  Cardiovascular:     Rate and Rhythm: Normal rate and regular rhythm.  Pulmonary:     Effort: Pulmonary effort is normal. No respiratory distress.  Abdominal:     Palpations: Abdomen is soft.     Tenderness: There is no abdominal tenderness. There is no guarding or rebound.  Musculoskeletal:        General: No swelling or tenderness.  Skin:    General: Skin is warm and dry.     Capillary Refill: Capillary refill takes less than 2 seconds.  Neurological:     Mental Status: She is alert and oriented to person, place, and time.  Psychiatric:        Mood and Affect: Mood normal.        Behavior: Behavior normal.      ED Treatments / Results  Labs (all labs ordered are listed, but only abnormal results are displayed) Labs Reviewed  URINALYSIS, ROUTINE W REFLEX MICROSCOPIC - Abnormal; Notable for the following components:      Result Value   Color, Urine ORANGE (*)    APPearance CLOUDY (*)    Glucose, UA 100 (*)    Hgb urine dipstick LARGE (*)    Protein,  ur 100 (*)    Nitrite POSITIVE (*)    Leukocytes, UA MODERATE (*)    All other components within normal limits  URINALYSIS, MICROSCOPIC (REFLEX) - Abnormal; Notable for the following components:  Bacteria, UA MANY (*)    All other components within normal limits  URINE CULTURE  PREGNANCY, URINE    EKG None  Radiology No results found.  Procedures Procedures (including critical care time)  Medications Ordered in ED Medications  cephALEXin (KEFLEX) capsule 500 mg (has no administration in time range)     Initial Impression / Assessment and Plan / ED Course  I have reviewed the triage vital signs and the nursing notes.  Pertinent labs & imaging results that were available during my care of the patient were reviewed by me and considered in my medical decision making (see chart for details).     Patient with history of UTI here for evaluation of urinary frequency and dysuria. UA is consistent with UTI. Treat with antibiotics. Current presentation is not consistent with pyelonephritis, renal colic. Counseled patient on home care, outpatient follow-up and return precautions.  Final Clinical Impressions(s) / ED Diagnoses   Final diagnoses:  None    ED Discharge Orders    None       Tilden Fossaees, Ami Mally, MD 01/27/18 2107

## 2018-01-27 NOTE — ED Triage Notes (Signed)
Dysuria, urinary frequency x 2 days.

## 2018-01-28 NOTE — Progress Notes (Signed)
NEUROLOGY CONSULTATION NOTE  Katie Alvarez MRN: [id number] DOB: 1995-04-25  Referring provider: Melene Plan, DO (ED referral) Primary care provider: Shirline Frees, NP  Reason for consult:  headache  HISTORY OF PRESENT ILLNESS: Katie Alvarez is a 22 year old right-handed female who presents for headaches.  History supplemented by referring provider's note.  Onset:  Childhood.   Location:  Usually behind both eyes (predominantly left) and radiates down to the jaw or back of head Quality:  Pressure, sharp Intensity:  7/10.  She denies new headache, thunderclap headache or severe headache that wakes her from sleep. Aura:  no Prodrome:  no Postdrome:  Feels off for 1 to 2 days (fatigue) Associated symptoms:  Nausea, vomiting, photophobia, phonophobia, brief specks in vision when she blinks.  She denies associated unilateral numbness or weakness. Duration:  1 to 2 days Frequency:  Some sort of headache 5-6 days a month.  Mild headache once every 2 weeks, moderate one once a month, severe one every 3 to 6 months (requiring ED visit) Frequency of abortive medication: takes Tylenol or ibuprofen daily Triggers:  Hunger/skipped meals, strong scents, MSG Relieving factors:  Sleep, cold shower Activity:  Can't function  Current NSAIDS:  ibuprofen Current analgesics:  Tylenol Current triptans:  none Current ergotamine:  none Current anti-emetic:  none Current muscle relaxants:  none Current anti-anxiolytic:  none Current sleep aide:  none Current Antihypertensive medications:  none Current Antidepressant medications:  Citalopram 10mg  Current Anticonvulsant medications:  none Current anti-CGRP:  none Current Vitamins/Herbal/Supplements:  none Current Antihistamines/Decongestants:  none Other therapy:  none IUD:  [place]  Past NSAIDS:  none Past analgesics:  none Past abortive triptans:  none Past abortive ergotamine:  none Past muscle relaxants:  none Past anti-emetic:  none Past  antihypertensive medications:  none Past antidepressant medications:  none Past anticonvulsant medications:  none Past anti-CGRP:  none Past vitamins/Herbal/Supplements:  riboflavin Past antihistamines/decongestants:  none Other past therapies:  none  Caffeine:  20 oz Dr. Reino Kent most days, occasional coffee Alcohol:  yes Smoker:  1 cigarette a week. Diet: 32 oz water daily, does not skip meals Exercise:  yes Depression:  Yes; Anxiety:  yes Other pain:  Knee pain Sleep hygiene:  varies Family history of headache:  Mom has migraines  PAST MEDICAL HISTORY: Past Medical History:  Diagnosis Date  . Band keratopathy of both eyes   . Hypertension 11/20/2013  . Hypophosphatemia   . Migraines    with aura  . Mononucleosis   . Mononucleosis, infectious, with hepatitis    per patient no hepatitis  . STD (sexually transmitted disease) 02/10/2017   chlamydia   . Vitamin D deficiency disease     PAST SURGICAL HISTORY: Past Surgical History:  Procedure Laterality Date  . ANTERIOR CRUCIATE LIGAMENT REPAIR     2013 & 2015  . INTRAUTERINE DEVICE INSERTION  01/2017   Kyleena   . liletta iud     inserted 11-23-15  . OTHER SURGICAL HISTORY Right 2013   ACL reconstruction times 2  . WISDOM TOOTH EXTRACTION      MEDICATIONS: Current Outpatient Medications on File Prior to Visit  Medication Sig Dispense Refill  . acetaminophen (TYLENOL) 500 MG tablet Take 500-1,000 mg by mouth every 6 (six) hours as needed for mild pain.     . cephALEXin (KEFLEX) 500 MG capsule Take 1 capsule (500 mg total) by mouth 2 (two) times daily. 10 capsule 0  . cetirizine (ZYRTEC) 10 MG tablet Take 10 mg  by mouth 2 (two) times daily.     . Cholecalciferol (VITAMIN D PO) Take 1 capsule by mouth daily.     . citalopram (CELEXA) 20 MG tablet TAKE 1 TABLET BY MOUTH EVERY DAY 90 tablet 1  . EPINEPHrine 0.3 mg/0.3 mL IJ SOAJ injection Inject 0.3 mLs (0.3 mg total) into the muscle once. 1 Device 0  . ibuprofen  (ADVIL,MOTRIN) 800 MG tablet Take 1 tablet (800 mg total) by mouth every 8 (eight) hours as needed. 30 tablet 1  . Levonorgestrel (KYLEENA) 19.5 MG IUD by Intrauterine route.    . ranitidine (ZANTAC) 150 MG tablet Take 150 mg by mouth 2 (two) times daily.  3  . scopolamine (TRANSDERM-SCOP) 1 MG/3DAYS Place 1 patch (1.5 mg total) onto the skin every 3 (three) days. 10 patch 0  . triamcinolone cream (KENALOG) 0.1 % Apply 1 application topically 2 (two) times daily. 45 g 0   No current facility-administered medications on file prior to visit.     ALLERGIES: Allergies  Allergen Reactions  . Sulfa Antibiotics Hives and Shortness Of Breath  . Dust Mite Extract Hives    FAMILY HISTORY: Family History  Problem Relation Age of Onset  . Diabetes Mother   . Obesity Mother   . Asthma Mother   . Thyroid disease Maternal Aunt   . Thyroid disease Maternal Grandmother   . Osteoporosis Maternal Grandmother   . Asthma Maternal Uncle    SOCIAL HISTORY: Social History   Socioeconomic History  . Marital status: Single    Spouse name: Not on file  . Number of children: Not on file  . Years of education: Not on file  . Highest education level: Not on file  Occupational History  . 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 Financial resource strain: Not on file  . Food insecurity:    Worry: Not on file    Inability: Not on file  . Transportation needs:    Medical: Not on file    Non-medical: Not on file  Tobacco Use  . Smoking status: Never Smoker  . Smokeless tobacco: Never Used  Substance and Sexual Activity  . Alcohol use: No    Alcohol/week: 0.0 standard drinks  . Drug use: No  . Sexual activity: Yes    Partners: Male    Birth control/protection: I.U.D.  Lifestyle  . Physical activity:    Days per week: Not on file    Minutes per session: Not on file  . Stress: Not on file  Relationships  . Social connections:    Talks on phone: Not on file    Gets  together: Not on file    Attends religious service: Not on file    Active member of club or organization: Not on file    Attends meetings of clubs or organizations: Not on file    Relationship status: Not on file  . Intimate partner violence:    Fear of current or ex partner: Not on file    Emotionally abused: Not on file    Physically abused: Not on file    Forced sexual activity: Not on file  Other Topics Concern  . Not on file  Social History Narrative   Lives with parents and brother. In 8th grade. Plays volleyball (in school and rec), in spanish club. Wants to go to college and be a child abuse counselor.    REVIEW OF SYSTEMS: Constitutional: No fevers, chills, or sweats, no  generalized fatigue, change in appetite Eyes: No visual changes, double vision, eye pain Ear, nose and throat: No hearing loss, ear pain, nasal congestion, sore throat Cardiovascular: No chest pain, palpitations Respiratory:  No shortness of breath at rest or with exertion, wheezes GastrointestinaI: No nausea, vomiting, diarrhea, abdominal pain, fecal incontinence Genitourinary:  No dysuria, urinary retention or frequency Musculoskeletal:  No neck pain, back pain Integumentary: No rash, pruritus, skin lesions Neurological: as above Psychiatric: No depression, insomnia, anxiety Endocrine: No palpitations, fatigue, diaphoresis, mood swings, change in appetite, change in weight, increased thirst Hematologic/Lymphatic:  No purpura, petechiae. Allergic/Immunologic: no itchy/runny eyes, nasal congestion, recent allergic reactions, rashes  PHYSICAL EXAM: Blood pressure 118/72, pulse 92, height 5' 4.5" (1.638 m), weight 196 lb (88.9 kg), SpO2 97 %. General: No acute distress.  Patient appears well-groomed.  Head:  Normocephalic/atraumatic Eyes:  fundi examined but not visualized Neck: supple, no paraspinal tenderness, full range of motion Back: No paraspinal tenderness Heart: regular rate and rhythm Lungs:  Clear to auscultation bilaterally. Vascular: No carotid bruits. Neurological Exam: Mental status: alert and oriented to person, place, and time, recent and remote memory intact, fund of knowledge intact, attention and concentration intact, speech fluent and not dysarthric, language intact. Cranial nerves: CN I: not tested CN II: pupils equal, round and reactive to light, visual fields intact CN III, IV, VI:  full range of motion, no nystagmus, no ptosis CN V: facial sensation intact CN VII: upper and lower face symmetric CN VIII: hearing intact CN IX, X: gag intact, uvula midline CN XI: sternocleidomastoid and trapezius muscles intact CN XII: tongue midline Bulk & Tone: normal, no fasciculations. Motor:  5/5 throughout  Sensation: temperature and vibration sensation intact. Deep Tendon Reflexes:  2+ throughout, toes downgoing.  Finger to nose testing:  Without dysmetria.  Heel to shin:  Without dysmetria.  Gait:  Normal station and stride.  Able to turn and tandem walk. Romberg negative.  IMPRESSION: Migraine without aura, without status migrainosus, not intractable  PLAN: 1.  For preventative management, will start topiramate 50 mg at bedtime.  Side effects discussed.  Advised to take precautions not to get pregnant due to potential teratogenic effects. 2.  For abortive therapy, sumatriptan 100 mg 3.  Limit use of pain relievers to no more than 2 days out of week to prevent risk of rebound or medication-overuse headache. 4.  Keep headache diary 5.  Exercise, hydration, caffeine cessation, sleep hygiene, monitor for and avoid triggers 6.  Consider:  magnesium citrate 400mg  daily, riboflavin 400mg  daily, and coenzyme Q10 100mg  three times daily 7.  Tobacco cessation counseling (CPT 99406):  Tobacco use with no history of CAD, stroke, or cancer  - Currently smoking 1 cigarette a week    - Patient was informed of the dangers of tobacco abuse including stroke, cancer, and MI, as well  as benefits of tobacco cessation.  - Patient is willing to quit at this time.  - Approximately 4 mins were spent counseling patient cessation techniques. We discussed various methods to help quit smoking, including deciding on a date to quit, joining a support group, pharmacological agents- nicotine gum/patch/lozenges, chantix.   - I will reassess her progress at the next follow-up visit 8. Follow up in 3 to 4 months.  Thank you for allowing me to take part in the care of this patient.  40 minutes spent face to face with patient, over 50% spent discussing management.  Shon MilletAdam Waverly Chavarria, DO  CC: Duane Lopeorey Nafziger, NP

## 2018-01-29 ENCOUNTER — Encounter: Payer: Self-pay | Admitting: Neurology

## 2018-01-29 ENCOUNTER — Encounter

## 2018-01-29 ENCOUNTER — Other Ambulatory Visit (INDEPENDENT_AMBULATORY_CARE_PROVIDER_SITE_OTHER): Payer: BLUE CROSS/BLUE SHIELD

## 2018-01-29 ENCOUNTER — Ambulatory Visit: Payer: BLUE CROSS/BLUE SHIELD | Admitting: Neurology

## 2018-01-29 VITALS — BP 118/72 | HR 92 | Ht 64.5 in | Wt 196.0 lb

## 2018-01-29 DIAGNOSIS — Z79899 Other long term (current) drug therapy: Secondary | ICD-10-CM | POA: Diagnosis not present

## 2018-01-29 DIAGNOSIS — F17200 Nicotine dependence, unspecified, uncomplicated: Secondary | ICD-10-CM

## 2018-01-29 DIAGNOSIS — G43009 Migraine without aura, not intractable, without status migrainosus: Secondary | ICD-10-CM | POA: Diagnosis not present

## 2018-01-29 LAB — BASIC METABOLIC PANEL
BUN: 9 mg/dL (ref 6–23)
CHLORIDE: 103 meq/L (ref 96–112)
CO2: 28 meq/L (ref 19–32)
Calcium: 9.5 mg/dL (ref 8.4–10.5)
Creatinine, Ser: 0.81 mg/dL (ref 0.40–1.20)
GFR: 95.02 mL/min (ref >60.00)
Glucose, Bld: 81 mg/dL (ref 70–99)
POTASSIUM: 4.3 meq/L (ref 3.5–5.1)
Sodium: 137 mEq/L (ref 135–145)

## 2018-01-29 LAB — URINE CULTURE: Culture: <10000 — AB

## 2018-01-29 MED ORDER — TOPIRAMATE 50 MG PO TABS: 50.0000 mg | ORAL_TABLET | Freq: Every day | ORAL | 3 refills | Status: DC

## 2018-01-29 MED ORDER — SUMATRIPTAN SUCCINATE 100 MG PO TABS: ORAL_TABLET | ORAL | 3 refills | Status: DC

## 2018-01-29 NOTE — Patient Instructions (Addendum)
Migraine Recommendations: 1.  Start topiramate 50mg  at bedtime.  Call in 4 weeks with update and we can adjust dose if needed.  2.  Take sumatriptan 100mg  at earliest onset of headache.  May repeat dose once in 2 hours if needed.  Do not exceed two tablets in 24 hours. 3.  Limit use of pain relievers to no more than 2 days out of the week.  These medications include acetaminophen, ibuprofen, triptans and narcotics.  This will help reduce risk of rebound headaches. 4.  Be aware of common food triggers such as processed sweets, processed foods with nitrites (such as deli meat, hot dogs, sausages), foods with MSG, alcohol (such as wine), chocolate, certain cheeses, certain fruits (dried fruits, bananas, pineapple), vinegar, diet soda. 4.  Avoid caffeine 5.  Routine exercise 6.  Proper sleep hygiene 7.  Stay adequately hydrated with water 8.  Keep a headache diary. 9.  Maintain proper stress management. 10.  Do not skip meals. 11.  Consider supplements:  Magnesium citrate 400mg  to 600mg  daily, riboflavin 400mg , Coenzyme Q 10 100mg  three times daily 12. We will check BMP lab today. 13.  Follow up in 3 to 4 months

## 2018-02-14 ENCOUNTER — Encounter: Payer: Self-pay | Admitting: Neurology

## 2018-02-20 ENCOUNTER — Telehealth: Payer: Self-pay | Admitting: Neurology

## 2018-02-20 NOTE — Telephone Encounter (Signed)
She can increase now.

## 2018-02-20 NOTE — Telephone Encounter (Signed)
Patient is wanting to know if she can up her dosage of the Topiramate medication from 50mg  to 100mg  to help her. Please let her know and call her back at [phone]. Thanks!

## 2018-02-20 NOTE — Telephone Encounter (Signed)
Called and advised Pt 

## 2018-03-27 ENCOUNTER — Telehealth: Payer: Self-pay | Admitting: Neurology

## 2018-03-27 MED ORDER — TOPIRAMATE 100 MG PO TABS: 100.0000 mg | ORAL_TABLET | Freq: Every day | ORAL | 3 refills | Status: DC

## 2018-03-27 NOTE — Telephone Encounter (Signed)
Patient needs to have [place] call in a updated RX for the topamax 100mg   She uses [facility]

## 2018-05-11 ENCOUNTER — Telehealth: Payer: Self-pay | Admitting: Adult Health

## 2018-05-11 NOTE — Telephone Encounter (Signed)
Copied from CRM [id number]. Topic: Quick Communication - See Telephone Encounter >> May 11, 2018  9:33 AM Lorrine Kin, NT wrote: CRM for notification. See Telephone encounter for: 05/11/18. Patient calling and states that she has some Exema on thighs on her legs. A while back she was prescribed a cream that she left at college. triamcinolone cream (KENALOG) 0.1 % [facility] - [place], [place] - [address]

## 2018-05-14 NOTE — Telephone Encounter (Signed)
Pt scheduled for virtual visit with [facility] on 05/15/2018.  Will forward to [facility] for review.

## 2018-05-15 ENCOUNTER — Ambulatory Visit (INDEPENDENT_AMBULATORY_CARE_PROVIDER_SITE_OTHER): Payer: BLUE CROSS/BLUE SHIELD | Admitting: Adult Health

## 2018-05-15 ENCOUNTER — Encounter: Payer: Self-pay | Admitting: Adult Health

## 2018-05-15 ENCOUNTER — Other Ambulatory Visit: Payer: Self-pay

## 2018-05-15 DIAGNOSIS — L309 Dermatitis, unspecified: Secondary | ICD-10-CM | POA: Diagnosis not present

## 2018-05-15 MED ORDER — TRIAMCINOLONE ACETONIDE 0.1 % EX CREA: 1.0000 "application " | TOPICAL_CREAM | Freq: Two times a day (BID) | CUTANEOUS | 2 refills | Status: DC

## 2018-05-15 NOTE — Progress Notes (Signed)
Virtual Visit via Video Note  I connected with Katie Alvarez  on 05/15/18 at 10:00 AM EDT by a video enabled telemedicine application and verified that I am speaking with the correct person using two identifiers.  Location patient: home Location provider:work or home office Persons participating in the virtual visit: patient, provider  I discussed the limitations of evaluation and management by telemedicine and the availability of in person appointments. The patient expressed understanding and agreed to proceed.   HPI:  23 year old female who  has a past medical history of Band keratopathy of both eyes, Hypertension (11/20/2013), Hypophosphatemia, Migraines, Mononucleosis, Mononucleosis, infectious, with hepatitis, STD (sexually transmitted disease) (02/10/2017), and Vitamin D deficiency disease.  She is being evaluated today for recurrent eczema.  [place] and left her Kenalog cream at school in [place].  Over the last couple weeks she has had an eczema flare on her right inner thigh and at the back of the neck.  She would like to get a refill on this medication sent to [place].  Finally she reports that the last time we have seen each other she was started on Topamax 100 mg daily as well as Imitrex as needed for migraines.  She reports that she is had many less frequent headaches since starting Topamax and is very rarely using Imitrex.  Denies any side effects of Topamax  ROS: See pertinent positives and negatives per HPI.  Past Medical History:  Diagnosis Date  . Band keratopathy of both eyes   . Hypertension 11/20/2013  . Hypophosphatemia   . Migraines    with aura  . Mononucleosis   . Mononucleosis, infectious, with hepatitis    per patient no hepatitis  . STD (sexually transmitted disease) 02/10/2017   chlamydia   . Vitamin D deficiency disease     Past Surgical History:  Procedure Laterality Date  . ANTERIOR CRUCIATE LIGAMENT  REPAIR     2013 & 2015  . INTRAUTERINE DEVICE INSERTION  01/2017   Kyleena   . liletta iud     inserted 11-23-15  . OTHER SURGICAL HISTORY Right 2013   ACL reconstruction times 2  . WISDOM TOOTH EXTRACTION      Family History  Problem Relation Age of Onset  . Diabetes Mother   . Obesity Mother   . Asthma Mother   . Skin cancer Mother   . Thyroid disease Maternal Aunt   . Thyroid disease Maternal Grandmother   . Osteoporosis Maternal Grandmother   . Skin cancer Maternal Grandmother   . Asthma Maternal Uncle   . Heart attack Father   . Bipolar disorder Brother   . Asthma Brother   . Heart attack Maternal Grandfather        Current Outpatient Medications:  .  acetaminophen (TYLENOL) 500 MG tablet, Take 500-1,000 mg by mouth every 6 (six) hours as needed for mild pain. , Disp: , Rfl:  .  cetirizine (ZYRTEC) 10 MG tablet, Take 10 mg by mouth 2 (two) times daily. , Disp: , Rfl:  .  Cholecalciferol (VITAMIN D PO), Take 1 capsule by mouth daily. , Disp: , Rfl:  .  citalopram (CELEXA) 20 MG tablet, TAKE 1 TABLET BY MOUTH EVERY DAY, Disp: 90 tablet, Rfl: 1 .  EPINEPHrine 0.3 mg/0.3 mL IJ SOAJ injection, Inject 0.3 mLs (0.3 mg total) into the muscle once., Disp: 1 Device, Rfl: 0 .  ibuprofen (ADVIL,MOTRIN) 800 MG tablet, Take 1 tablet (  800 mg total) by mouth every 8 (eight) hours as needed., Disp: 30 tablet, Rfl: 1 .  Levonorgestrel (KYLEENA) 19.5 MG IUD, by Intrauterine route., Disp: , Rfl:  .  ranitidine (ZANTAC) 150 MG tablet, Take 150 mg by mouth 2 (two) times daily., Disp: , Rfl: 3 .  SUMAtriptan (IMITREX) 100 MG tablet, Take 1 tablet earliest onset of migraine.  May repeat x1 in 2 hours if headache persists or recurs.  Maximum 2 tablets/24 h, Disp: 10 tablet, Rfl: 3 .  topiramate (TOPAMAX) 100 MG tablet, Take 1 tablet (100 mg total) by mouth daily., Disp: 30 tablet, Rfl: 3 .  triamcinolone cream (KENALOG) 0.1 %, Apply 1 application topically 2 (two) times daily., Disp: 45 g, Rfl:  2  EXAM:  VITALS per patient if applicable:  GENERAL: alert, oriented, appears well and in no acute distress  HEENT: atraumatic, conjunttiva clear, no obvious abnormalities on inspection of external nose and ears  NECK: normal movements of the head and neck  LUNGS: on inspection no signs of respiratory distress, breathing rate appears normal, no obvious gross SOB, gasping or wheezing  CV: no obvious cyanosis  MS: moves all visible extremities without noticeable abnormality  PSYCH/NEURO: pleasant and cooperative, no obvious depression or anxiety, speech and thought processing grossly intact  SKIN: Eczema noted on right inner thigh and on neck.  ASSESSMENT AND PLAN:  Discussed the following assessment and plan:  Eczema, unspecified type - Plan: triamcinolone cream (KENALOG) 0.1 %     I discussed the assessment and treatment plan with the patient. The patient was provided an opportunity to ask questions and all were answered. The patient agreed with the plan and demonstrated an understanding of the instructions.   The patient was advised to call back or seek an in-person evaluation if the symptoms worsen or if the condition fails to improve as anticipated.   Shirline Frees, NP

## 2018-05-23 ENCOUNTER — Other Ambulatory Visit: Payer: Self-pay

## 2018-05-23 ENCOUNTER — Ambulatory Visit (INDEPENDENT_AMBULATORY_CARE_PROVIDER_SITE_OTHER): Payer: BLUE CROSS/BLUE SHIELD | Admitting: Adult Health

## 2018-05-23 ENCOUNTER — Encounter: Payer: Self-pay | Admitting: Adult Health

## 2018-05-23 ENCOUNTER — Telehealth: Payer: Self-pay | Admitting: Adult Health

## 2018-05-23 DIAGNOSIS — N3000 Acute cystitis without hematuria: Secondary | ICD-10-CM

## 2018-05-23 MED ORDER — FLUCONAZOLE 150 MG PO TABS: 150.0000 mg | ORAL_TABLET | Freq: Once | ORAL | 0 refills | Status: AC

## 2018-05-23 MED ORDER — NITROFURANTOIN MONOHYD MACRO 100 MG PO CAPS: 100.0000 mg | ORAL_CAPSULE | Freq: Two times a day (BID) | ORAL | 0 refills | Status: DC

## 2018-05-23 NOTE — Telephone Encounter (Signed)
Heather from [facility] called and left a vm which states she received the rx for the fluconazole. She states pt also taking citalopram and on her system it shows that these two medications are  contraindicated together because the fluconazole could increase the concentration of citalopram and the combination of both can increase the risk of "qtc prolongation".   She is requesting someone to give pharmacy a call back

## 2018-05-23 NOTE — Progress Notes (Signed)
Virtual Visit via Video Note  I connected with Katie Alvarez on 05/23/18 at  4:00 PM EDT by a video enabled telemedicine application and verified that I am speaking with the correct person using two identifiers.  Location patient: home Location provider:work or home office Persons participating in the virtual visit: patient, provider  I discussed the limitations of evaluation and management by telemedicine and the availability of in person appointments. The patient expressed understanding and agreed to proceed.   HPI: Katie Alvarez 23-year-old female who is being evaluated today for possible urinary tract infection.  She reports that her symptoms have been present for two days.  Symptoms include urinary frequency, dysuria, decreased stream, lower pelvic pressure, and intermittent chills.  Denies fevers, nausea, vomiting, or diarrhea.  No concern  for STDs   ROS: See pertinent positives and negatives per HPI.  Past Medical History:  Diagnosis Date  . Band keratopathy of both eyes   . Hypertension 11/20/2013  . Hypophosphatemia   . Migraines    with aura  . Mononucleosis   . Mononucleosis, infectious, with hepatitis    per patient no hepatitis  . STD (sexually transmitted disease) 02/10/2017   chlamydia   . Vitamin D deficiency disease     Past Surgical History:  Procedure Laterality Date  . ANTERIOR CRUCIATE LIGAMENT REPAIR     2013 & 2015  . INTRAUTERINE DEVICE INSERTION  01/2017   Kyleena   . liletta iud     inserted 11-23-15  . OTHER SURGICAL HISTORY Right 2013   ACL reconstruction times 2  . WISDOM TOOTH EXTRACTION      Family History  Problem Relation Age of Onset  . Diabetes Mother   . Obesity Mother   . Asthma Mother   . Skin cancer Mother   . Thyroid disease Maternal Aunt   . Thyroid disease Maternal Grandmother   . Osteoporosis Maternal Grandmother   . Skin cancer Maternal Grandmother   . Asthma Maternal Uncle   . Heart attack Father   . Bipolar disorder  Brother   . Asthma Brother   . Heart attack Maternal Grandfather      Current Outpatient Medications:  .  acetaminophen (TYLENOL) 500 MG tablet, Take 500-1,000 mg by mouth every 6 (six) hours as needed for mild pain. , Disp: , Rfl:  .  cetirizine (ZYRTEC) 10 MG tablet, Take 10 mg by mouth 2 (two) times daily. , Disp: , Rfl:  .  Cholecalciferol (VITAMIN D PO), Take 1 capsule by mouth daily. , Disp: , Rfl:  .  citalopram (CELEXA) 20 MG tablet, TAKE 1 TABLET BY MOUTH EVERY DAY, Disp: 90 tablet, Rfl: 1 .  EPINEPHrine 0.3 mg/0.3 mL IJ SOAJ injection, Inject 0.3 mLs (0.3 mg total) into the muscle once., Disp: 1 Device, Rfl: 0 .  fluconazole (DIFLUCAN) 150 MG tablet, Take 1 tablet (150 mg total) by mouth once for 1 dose., Disp: 1 tablet, Rfl: 0 .  ibuprofen (ADVIL,MOTRIN) 800 MG tablet, Take 1 tablet (800 mg total) by mouth every 8 (eight) hours as needed., Disp: 30 tablet, Rfl: 1 .  Levonorgestrel (KYLEENA) 19.5 MG IUD, by Intrauterine route., Disp: , Rfl:  .  nitrofurantoin, macrocrystal-monohydrate, (MACROBID) 100 MG capsule, Take 1 capsule (100 mg total) by mouth 2 (two) times daily., Disp: 10 capsule, Rfl: 0 .  ranitidine (ZANTAC) 150 MG tablet, Take 150 mg by mouth 2 (two) times daily., Disp: , Rfl: 3 .  SUMAtriptan (IMITREX) 100 MG tablet, Take 1 tablet earliest  onset of migraine.  May repeat x1 in 2 hours if headache persists or recurs.  Maximum 2 tablets/24 h, Disp: 10 tablet, Rfl: 3 .  topiramate (TOPAMAX) 100 MG tablet, Take 1 tablet (100 mg total) by mouth daily., Disp: 30 tablet, Rfl: 3 .  triamcinolone cream (KENALOG) 0.1 %, Apply 1 application topically 2 (two) times daily., Disp: 45 g, Rfl: 2  EXAM:  VITALS per patient if applicable:  GENERAL: alert, oriented, appears well and in no acute distress  HEENT: atraumatic, conjunttiva clear, no obvious abnormalities on inspection of external nose and ears  NECK: normal movements of the head and neck  LUNGS: on inspection no signs of  respiratory distress, breathing rate appears normal, no obvious gross SOB, gasping or wheezing  CV: no obvious cyanosis  MS: moves all visible extremities without noticeable abnormality  PSYCH/NEURO: pleasant and cooperative, no obvious depression or anxiety, speech and thought processing grossly intact  ASSESSMENT AND PLAN:  Discussed the following assessment and plan:  Symptoms consistent with urinary tract infection.  Will send in Macrobid as well as Diflucan in case she gets yeast infection.  Advised follow-up if no improvement in the next 2 or 3 days.  Stay well-hydrated and can take Tylenol or Motrin for symptom relief  Acute cystitis without hematuria - Plan: nitrofurantoin, macrocrystal-monohydrate, (MACROBID) 100 MG capsule, fluconazole (DIFLUCAN) 150 MG tablet   I discussed the assessment and treatment plan with the patient. The patient was provided an opportunity to ask questions and all were answered. The patient agreed with the plan and demonstrated an understanding of the instructions.   The patient was advised to call back or seek an in-person evaluation if the symptoms worsen or if the condition fails to improve as anticipated.   Shirline Frees, NP

## 2018-05-24 NOTE — Telephone Encounter (Signed)
Spoke to Katie Alvarez and advised that he fill both medications.  Nothing further needed.

## 2018-05-24 NOTE — Telephone Encounter (Signed)
Pt is calling back checking on the medication status

## 2018-05-24 NOTE — Telephone Encounter (Signed)
She is ok to take Diflucan

## 2018-05-24 NOTE — Telephone Encounter (Signed)
Katie Alvarez, is the combination appropriate to take?

## 2018-06-11 ENCOUNTER — Other Ambulatory Visit: Payer: Self-pay

## 2018-06-11 ENCOUNTER — Encounter: Payer: Self-pay | Admitting: Family Medicine

## 2018-06-11 ENCOUNTER — Ambulatory Visit (INDEPENDENT_AMBULATORY_CARE_PROVIDER_SITE_OTHER): Payer: BLUE CROSS/BLUE SHIELD | Admitting: Family Medicine

## 2018-06-11 DIAGNOSIS — N390 Urinary tract infection, site not specified: Secondary | ICD-10-CM | POA: Diagnosis not present

## 2018-06-11 DIAGNOSIS — R319 Hematuria, unspecified: Secondary | ICD-10-CM | POA: Diagnosis not present

## 2018-06-11 MED ORDER — CIPROFLOXACIN HCL 500 MG PO TABS: 500.0000 mg | ORAL_TABLET | Freq: Two times a day (BID) | ORAL | 0 refills | Status: DC

## 2018-06-11 NOTE — Progress Notes (Signed)
Subjective:    Patient ID: Katie Alvarez, female    DOB: [birth year], 23 y.o.   MRN: [id number]  HPI Virtual Visit via Video Note  I connected with the patient on 06/11/18 at  2:00 PM EDT by a video enabled telemedicine application and verified that I am speaking with the correct person using two identifiers.  Location patient: home Location provider:work or home office Persons participating in the virtual visit: patient, provider  I discussed the limitations of evaluation and management by telemedicine and the availability of in person appointments. The patient expressed understanding and agreed to proceed.   HPI: Here for 3 days of low back pain, urgency to urinate with burning, and occasional blood in the urine. He feels chilled but does not have a fever. Some nausea without vomiting. She admits to drinking a lot of soda and very little water. She was seen in our clinic for a UTI on 05-23-18 and was given Macrobid. She seemed to feel better for a short time.    ROS: See pertinent positives and negatives per HPI.  Past Medical History:  Diagnosis Date  . Band keratopathy of both eyes   . Hypertension 11/20/2013  . Hypophosphatemia   . Migraines    with aura  . Mononucleosis   . Mononucleosis, infectious, with hepatitis    per patient no hepatitis  . STD (sexually transmitted disease) 02/10/2017   chlamydia   . Vitamin D deficiency disease     Past Surgical History:  Procedure Laterality Date  . ANTERIOR CRUCIATE LIGAMENT REPAIR     2013 & 2015  . INTRAUTERINE DEVICE INSERTION  01/2017   Kyleena   . liletta iud     inserted 11-23-15  . OTHER SURGICAL HISTORY Right 2013   ACL reconstruction times 2  . WISDOM TOOTH EXTRACTION      Family History  Problem Relation Age of Onset  . Diabetes Mother   . Obesity Mother   . Asthma Mother   . Skin cancer Mother   . Thyroid disease Maternal Aunt   . Thyroid disease Maternal Grandmother   . Osteoporosis Maternal  Grandmother   . Skin cancer Maternal Grandmother   . Asthma Maternal Uncle   . Heart attack Father   . Bipolar disorder Brother   . Asthma Brother   . Heart attack Maternal Grandfather      Current Outpatient Medications:  .  acetaminophen (TYLENOL) 500 MG tablet, Take 500-1,000 mg by mouth every 6 (six) hours as needed for mild pain. , Disp: , Rfl:  .  cetirizine (ZYRTEC) 10 MG tablet, Take 10 mg by mouth 2 (two) times daily. , Disp: , Rfl:  .  Cholecalciferol (VITAMIN D PO), Take 1 capsule by mouth daily. , Disp: , Rfl:  .  citalopram (CELEXA) 20 MG tablet, TAKE 1 TABLET BY MOUTH EVERY DAY, Disp: 90 tablet, Rfl: 1 .  EPINEPHrine 0.3 mg/0.3 mL IJ SOAJ injection, Inject 0.3 mLs (0.3 mg total) into the muscle once., Disp: 1 Device, Rfl: 0 .  ibuprofen (ADVIL,MOTRIN) 800 MG tablet, Take 1 tablet (800 mg total) by mouth every 8 (eight) hours as needed., Disp: 30 tablet, Rfl: 1 .  Levonorgestrel (KYLEENA) 19.5 MG IUD, by Intrauterine route., Disp: , Rfl:  .  ranitidine (ZANTAC) 150 MG tablet, Take 150 mg by mouth 2 (two) times daily., Disp: , Rfl: 3 .  SUMAtriptan (IMITREX) 100 MG tablet, Take 1 tablet earliest onset of migraine.  May repeat x1 in  2 hours if headache persists or recurs.  Maximum 2 tablets/24 h, Disp: 10 tablet, Rfl: 3 .  topiramate (TOPAMAX) 100 MG tablet, Take 1 tablet (100 mg total) by mouth daily., Disp: 30 tablet, Rfl: 3 .  triamcinolone cream (KENALOG) 0.1 %, Apply 1 application topically 2 (two) times daily., Disp: 45 g, Rfl: 2 .  ciprofloxacin (CIPRO) 500 MG tablet, Take 1 tablet (500 mg total) by mouth 2 (two) times daily., Disp: 14 tablet, Rfl: 0  EXAM:  VITALS per patient if applicable:  GENERAL: alert, oriented, appears well and in no acute distress  HEENT: atraumatic, conjunttiva clear, no obvious abnormalities on inspection of external nose and ears  NECK: normal movements of the head and neck  LUNGS: on inspection no signs of respiratory distress,  breathing rate appears normal, no obvious gross SOB, gasping or wheezing  CV: no obvious cyanosis  MS: moves all visible extremities without noticeable abnormality  PSYCH/NEURO: pleasant and cooperative, no obvious depression or anxiety, speech and thought processing grossly intact  ASSESSMENT AND PLAN: Recurrent UTI, treat with Cipro. I urged her to drink plenty of water and to cut back on soda consumption.  Gershon CraneStephen Fry, MD  Discussed the following assessment and plan:  No diagnosis found.     I discussed the assessment and treatment plan with the patient. The patient was provided an opportunity to ask questions and all were answered. The patient agreed with the plan and demonstrated an understanding of the instructions.   The patient was advised to call back or seek an in-person evaluation if the symptoms worsen or if the condition fails to improve as anticipated.     Review of Systems     Objective:   Physical Exam        Assessment & Plan:

## 2018-06-12 ENCOUNTER — Other Ambulatory Visit: Payer: Self-pay | Admitting: Neurology

## 2018-06-15 ENCOUNTER — Emergency Department (HOSPITAL_BASED_OUTPATIENT_CLINIC_OR_DEPARTMENT_OTHER)
Admission: EM | Admit: 2018-06-15 | Discharge: 2018-06-15 | Disposition: A | Discharge status: Home / Self Care | Payer: BLUE CROSS/BLUE SHIELD | Attending: Emergency Medicine | Admitting: Emergency Medicine

## 2018-06-15 ENCOUNTER — Other Ambulatory Visit: Payer: Self-pay

## 2018-06-15 ENCOUNTER — Encounter (HOSPITAL_BASED_OUTPATIENT_CLINIC_OR_DEPARTMENT_OTHER): Payer: Self-pay | Admitting: Adult Health

## 2018-06-15 ENCOUNTER — Emergency Department (HOSPITAL_BASED_OUTPATIENT_CLINIC_OR_DEPARTMENT_OTHER): Payer: BLUE CROSS/BLUE SHIELD

## 2018-06-15 DIAGNOSIS — Z79899 Other long term (current) drug therapy: Secondary | ICD-10-CM | POA: Insufficient documentation

## 2018-06-15 DIAGNOSIS — F17210 Nicotine dependence, cigarettes, uncomplicated: Secondary | ICD-10-CM | POA: Insufficient documentation

## 2018-06-15 DIAGNOSIS — N730 Acute parametritis and pelvic cellulitis: Secondary | ICD-10-CM

## 2018-06-15 DIAGNOSIS — R300 Dysuria: Secondary | ICD-10-CM | POA: Diagnosis present

## 2018-06-15 DIAGNOSIS — N739 Female pelvic inflammatory disease, unspecified: Secondary | ICD-10-CM | POA: Diagnosis not present

## 2018-06-15 DIAGNOSIS — I10 Essential (primary) hypertension: Secondary | ICD-10-CM | POA: Insufficient documentation

## 2018-06-15 LAB — WET PREP, GENITAL
Clue Cells Wet Prep HPF POC: NONE SEEN
Sperm: NONE SEEN
Trich, Wet Prep: NONE SEEN
Yeast Wet Prep HPF POC: NONE SEEN

## 2018-06-15 LAB — URINALYSIS, ROUTINE W REFLEX MICROSCOPIC
Bilirubin Urine: NEGATIVE
Glucose, UA: NEGATIVE mg/dL
Ketones, ur: NEGATIVE mg/dL
Nitrite: NEGATIVE
Protein, ur: NEGATIVE mg/dL
Specific Gravity, Urine: >1.03 — ABNORMAL HIGH (ref 1.005–1.030)
pH: 7.5 (ref 5.0–8.0)

## 2018-06-15 LAB — URINALYSIS, MICROSCOPIC (REFLEX)

## 2018-06-15 LAB — PREGNANCY, URINE: Preg Test, Ur: NEGATIVE

## 2018-06-15 MED ORDER — ONDANSETRON 4 MG PO TBDP
ORAL_TABLET | ORAL | Status: AC
  Filled 2018-06-15: qty 1

## 2018-06-15 MED ORDER — ONDANSETRON 4 MG PO TBDP
4.0000 mg | ORAL_TABLET | Freq: Once | ORAL | Status: AC
  Administered 2018-06-15: 4 mg via ORAL

## 2018-06-15 MED ORDER — AZITHROMYCIN 250 MG PO TABS
1000.0000 mg | ORAL_TABLET | Freq: Once | ORAL | Status: AC
  Administered 2018-06-15: 1000 mg via ORAL
  Filled 2018-06-15: qty 4

## 2018-06-15 MED ORDER — DOXYCYCLINE HYCLATE 100 MG PO CAPS: 100.0000 mg | ORAL_CAPSULE | Freq: Two times a day (BID) | ORAL | 0 refills | Status: AC

## 2018-06-15 MED ORDER — CEFTRIAXONE SODIUM 250 MG IJ SOLR
250.0000 mg | Freq: Once | INTRAMUSCULAR | Status: AC
  Administered 2018-06-15: 250 mg via INTRAMUSCULAR
  Filled 2018-06-15: qty 250

## 2018-06-15 MED ORDER — LIDOCAINE HCL (PF) 1 % IJ SOLN
INTRAMUSCULAR | Status: AC
  Administered 2018-06-15: 5 mL
  Filled 2018-06-15: qty 5

## 2018-06-15 MED ORDER — HYDROCODONE-ACETAMINOPHEN 5-325 MG PO TABS
1.0000 | ORAL_TABLET | Freq: Once | ORAL | Status: AC
  Administered 2018-06-15: 1 via ORAL
  Filled 2018-06-15: qty 1

## 2018-06-15 NOTE — Discharge Instructions (Addendum)
Take antibiotics as prescribed. Take the entire course, even if your symptoms improve.  Use Tylenol and ibuprofen as needed for pain. Follow-up with your OB/GYN for further evaluation and discussion about her IUD and frequency of infections. You were tested for gonorrhea and chlamydia today.  Results are pending.  If positive, you should receive a phone call.  If negative, you will not.  Either way, you may check online on MyChart.  You were treated for gonorrhea chlamydia today, so you do not need further treatment.  However, you will need to let your partner know. Return to the emergency room with any new, worsening, concerning symptoms.

## 2018-06-15 NOTE — ED Provider Notes (Signed)
[facility] EMERGENCY DEPARTMENT Provider Note   CSN: [id number] Arrival date & time: 06/15/18  1254    History   Chief Complaint Chief Complaint  Patient presents with  . Dysuria    HPI Katie Alvarez is a 23 y.o. female presenting for evaluation of suprapubic pain and dysuria.  Patient states for the past month, she has been having urinary symptoms.  She has had multiple rounds of antibiotics including Macrobid, Cipro, and Augmentin.  She states her suprapubic pain has been gradually worsening, and she has low back pain as well.  She reports associated nausea, but no vomiting.  She has fevers, chills, chest pain, shortness of breath, cough, upper abdominal pain, abnormal bowel movements.  Was concerned that she had a yeast infection, so took Diflucan without improvement of symptoms.  She is currently on Cipro.  She is sexually active with one female partner, started seeing him about a month and a half ago.  He does not have any symptoms.  Patient has a history of previous STD infection resulting in PID, history of positive ANA with unknown cause, history of anxiety, depression, and migraines, for which she takes medication.     HPI  Past Medical History:  Diagnosis Date  . Band keratopathy of both eyes   . Hypertension 11/20/2013  . Hypophosphatemia   . Migraines    with aura  . Mononucleosis   . Mononucleosis, infectious, with hepatitis    per patient no hepatitis  . STD (sexually transmitted disease) 02/10/2017   chlamydia   . Vitamin D deficiency disease     Patient Active Problem List   Diagnosis Date Noted  . PID (acute pelvic inflammatory disease) 02/14/2017  . Chlamydia 02/14/2017  . IUD (intrauterine device) in place 02/14/2017  . Nausea with vomiting 02/14/2017  . Back strain 04/23/2015  . S/P ACL reconstruction 04/23/2015  . Non morbid obesity due to excess calories 03/27/2015  . ANA positive 01/19/2014  . Primary hypertension 11/20/2013  .  Angioedema 11/18/2013  . Urticaria, chronic 11/18/2013  . DUB (dysfunctional uterine bleeding) 04/26/2012  . Hypophosphatemia   . Band keratopathy of both eyes   . Mononucleosis, infectious, with hepatitis   . Vitamin D deficiency disease   . 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 Band-shaped keratopathy 06/17/2010    Past Surgical History:  Procedure Laterality Date  . ANTERIOR CRUCIATE LIGAMENT REPAIR Bilateral    2013 & 2015  . INTRAUTERINE DEVICE INSERTION  01/2017   Kyleena   . liletta iud     inserted 11-23-15  . OTHER SURGICAL HISTORY Right 2013   ACL reconstruction times 2  . WISDOM TOOTH EXTRACTION       OB History    Gravida  0   Para  0   Term  0   Preterm  0   AB  0   Living  0     SAB  0   TAB  0   Ectopic  0   Multiple  0   Live Births               Home Medications    Prior to Admission medications   Medication Sig Start Date End Date Taking? Authorizing Provider  ciprofloxacin (CIPRO) 500 MG tablet Take 1 tablet (500 mg total) by mouth 2 (two) times daily. 06/11/18  Yes Nelwyn SalisburyFry, Stephen A, MD  citalopram (CELEXA) 20 MG tablet TAKE 1 TABLET BY MOUTH EVERY DAY 12/26/17  Yes Nafziger,  Kandee Keen, NP  Levonorgestrel (KYLEENA) 19.5 MG IUD by Intrauterine route.   Yes [provider]  SUMAtriptan (IMITREX) 100 MG tablet TAKE 1 TAB AT ONSET OF MIGRAINE. MAY REPEAT IN 2 HRS IF HEADACHE PERSISTS OR RECURS. MAX 2 TAB/24 H 06/12/18  Yes Jaffe, Adam R, DO  topiramate (TOPAMAX) 100 MG tablet Take 1 tablet (100 mg total) by mouth daily. 03/27/18  Yes Everlena Cooper, Adam R, DO  acetaminophen (TYLENOL) 500 MG tablet Take 500-1,000 mg by mouth every 6 (six) hours as needed for mild pain.     [provider]  cetirizine (ZYRTEC) 10 MG tablet Take 10 mg by mouth 2 (two) times daily.     [provider]  Cholecalciferol (VITAMIN D PO) Take 1 capsule by mouth daily.     [provider]  doxycycline (VIBRAMYCIN) 100 MG capsule Take  1 capsule (100 mg total) by mouth 2 (two) times daily for 14 days. 06/15/18 06/29/18  Lawton Dollinger, PA-C  EPINEPHrine 0.3 mg/0.3 mL IJ SOAJ injection Inject 0.3 mLs (0.3 mg total) into the muscle once. 11/21/13   Rockney Ghee, MD  ibuprofen (ADVIL,MOTRIN) 800 MG tablet Take 1 tablet (800 mg total) by mouth every 8 (eight) hours as needed. 02/13/17   Romualdo Bolk, MD  ranitidine (ZANTAC) 150 MG tablet Take 150 mg by mouth 2 (two) times daily. 04/03/15   [provider]  triamcinolone cream (KENALOG) 0.1 % Apply 1 application topically 2 (two) times daily. 05/15/18   Shirline Frees, NP    Family History Family History  Problem Relation Age of Onset  . Diabetes Mother   . Obesity Mother   . Asthma Mother   . Skin cancer Mother   . Thyroid disease Maternal Aunt   . Thyroid disease Maternal Grandmother   . Osteoporosis Maternal Grandmother   . Skin cancer Maternal Grandmother   . Asthma Maternal Uncle   . Heart attack Father   . Bipolar disorder Brother   . Asthma Brother   . Heart attack Maternal Grandfather     Social History Social History   Tobacco Use  . Smoking status: Current Every Day Smoker    Types: Cigarettes  . Smokeless tobacco: Never Used  Substance Use Topics  . Alcohol use: Yes    Alcohol/week: 0.0 standard drinks    Comment: wine, coctails  . Drug use: No     Allergies   Sulfa antibiotics and Dust mite extract   Review of Systems Review of Systems  Genitourinary: Positive for dysuria and pelvic pain.  All other systems reviewed and are negative.    Physical Exam Updated Vital Signs BP 117/72 (BP Location: Right Arm)   Pulse 68   Temp 98.9 F (37.2 C) (Oral)   Resp 18   SpO2 100%   Physical Exam Vitals signs and nursing note reviewed. Exam conducted with a chaperone present.  Constitutional:      General: She is not in acute distress.    Appearance: She is well-developed.     Comments: Appears nontoxic  HENT:      Head: Normocephalic and atraumatic.  Eyes:     Conjunctiva/sclera: Conjunctivae normal.     Pupils: Pupils are equal, round, and reactive to light.  Neck:     Musculoskeletal: Normal range of motion and neck supple.  Cardiovascular:     Rate and Rhythm: Normal rate and regular rhythm.  Pulmonary:     Effort: Pulmonary effort is normal. No respiratory distress.  Breath sounds: Normal breath sounds. No wheezing.  Abdominal:     General: There is no distension.     Palpations: Abdomen is soft. There is no mass.     Tenderness: There is abdominal tenderness. There is no right CVA tenderness, left CVA tenderness, guarding or rebound.     Comments: Suprapubic tenderness.  No tenderness palpation elsewhere in the abdomen.  No CVA tenderness.  No rigidity, guarding, distention.  Negative rebound.  Genitourinary:    Exam position: Supine.     Labia:        Right: No rash, tenderness, lesion or injury.        Left: No rash, tenderness, lesion or injury.      Vagina: Normal.     Cervix: Cervical motion tenderness and cervical bleeding present.     Adnexa:        Left: Tenderness present.      Comments: Tenderness with pelvic exam.  CMT and left adnexal tenderness.  Patient with both bloody and greenish discharge. Musculoskeletal: Normal range of motion.  Skin:    General: Skin is warm and dry.     Capillary Refill: Capillary refill takes less than 2 seconds.  Neurological:     Mental Status: She is alert and oriented to person, place, and time.      ED Treatments / Results  Labs (all labs ordered are listed, but only abnormal results are displayed) Labs Reviewed  WET PREP, GENITAL - Abnormal; Notable for the following components:      Result Value   WBC, Wet Prep HPF POC MANY (*)    All other components within normal limits  URINALYSIS, ROUTINE W REFLEX MICROSCOPIC - Abnormal; Notable for the following components:   APPearance CLOUDY (*)    Specific Gravity, Urine >1.030 (*)     Hgb urine dipstick TRACE (*)    Leukocytes,Ua SMALL (*)    All other components within normal limits  URINALYSIS, MICROSCOPIC (REFLEX) - Abnormal; Notable for the following components:   Bacteria, UA MANY (*)    All other components within normal limits  URINE CULTURE  PREGNANCY, URINE  GC/CHLAMYDIA PROBE AMP ([place]) NOT AT [facility]    EKG None  Radiology US Transvaginal Non-ob  Result Date: 06/15/2018 CLINICAL DATA:  Left adnexal tenderness.  Concern for TOA. EXAM: TRANSABDOMINAL AND TRANSVAGINAL ULTRASOUND OF PELVIS TECHNIQUE: Both transabdominal and transvaginal ultrasound examinations of the pelvis were performed. Transabdominal technique was performed for global imaging of the pelvis including uterus, ovaries, adnexal regions, and pelvic cul-de-sac. It was necessary to proceed with endovaginal exam following the transabdominal exam to visualize the endometrium and ovaries. COMPARISON:  Pelvic ultrasound dated February 10, 2017. FINDINGS: Uterus Measurements: 6.7 x 2.8 x 3.9 cm = volume: 38 mL. No fibroids or other mass visualized. Abnormal low positioning of the IUD in the lower uterine segment and cervix. Endometrium Thickness: 4 mm.  No focal abnormality visualized. Right ovary Measurements: 3.9 x 1.8 x 2.0 cm = volume: 7 mL. Normal appearance/no adnexal mass. Left ovary Measurements: 3.2 x 2.6 x 3.1 cm = volume: 14 mL. Normal appearance/no adnexal mass. Other findings Trace free fluid in the pelvis, likely physiologic. IMPRESSION: 1. Abnormal low positioning of the IUD in the lower uterine segment and cervix. 2. Otherwise unremarkable pelvic ultrasound. No evidence of tubo-ovarian abscess. Electronically Signed   By: Obie Dredge M.D.   On: 06/15/2018 16:00   US Pelvis Complete  Result Date: 06/15/2018 CLINICAL DATA:  Left adnexal  tenderness.  Concern for TOA. EXAM: TRANSABDOMINAL AND TRANSVAGINAL ULTRASOUND OF PELVIS TECHNIQUE: Both transabdominal and transvaginal ultrasound  examinations of the pelvis were performed. Transabdominal technique was performed for global imaging of the pelvis including uterus, ovaries, adnexal regions, and pelvic cul-de-sac. It was necessary to proceed with endovaginal exam following the transabdominal exam to visualize the endometrium and ovaries. COMPARISON:  Pelvic ultrasound dated February 10, 2017. FINDINGS: Uterus Measurements: 6.7 x 2.8 x 3.9 cm = volume: 38 mL. No fibroids or other mass visualized. Abnormal low positioning of the IUD in the lower uterine segment and cervix. Endometrium Thickness: 4 mm.  No focal abnormality visualized. Right ovary Measurements: 3.9 x 1.8 x 2.0 cm = volume: 7 mL. Normal appearance/no adnexal mass. Left ovary Measurements: 3.2 x 2.6 x 3.1 cm = volume: 14 mL. Normal appearance/no adnexal mass. Other findings Trace free fluid in the pelvis, likely physiologic. IMPRESSION: 1. Abnormal low positioning of the IUD in the lower uterine segment and cervix. 2. Otherwise unremarkable pelvic ultrasound. No evidence of tubo-ovarian abscess. Electronically Signed   By: Obie Dredge M.D.   On: 06/15/2018 16:00    Procedures Procedures (including critical care time)  Medications Ordered in ED Medications  HYDROcodone-acetaminophen (NORCO/VICODIN) 5-325 MG per tablet 1 tablet (1 tablet Oral Given 06/15/18 1435)  ondansetron (ZOFRAN-ODT) disintegrating tablet 4 mg (4 mg Oral Given 06/15/18 1434)  cefTRIAXone (ROCEPHIN) injection 250 mg (250 mg Intramuscular Given 06/15/18 1645)  azithromycin (ZITHROMAX) tablet 1,000 mg (1,000 mg Oral Given 06/15/18 1642)  lidocaine (PF) (XYLOCAINE) 1 % injection (5 mLs  Given 06/15/18 1648)     Initial Impression / Assessment and Plan / ED Course  I have reviewed the triage vital signs and the nursing notes.  Pertinent labs & imaging results that were available during my care of the patient were reviewed by me and considered in my medical decision making (see chart for details).         Patient presenting for evaluation of suprapubic pain and dysuria.  Physical exam reassuring, she appears nontoxic.  However, pelvic exam is concerning, as patient is very tender, including CMT and left adnexal tenderness.  Also, patient with discharge consistent with possible STD.  Urine is not convincing for a UTI, as she only has small leuks.  Favor other causes for bacteria in the urine such as STD.  Wet prep negative, however many white cells seen.  As such, will treat for gonorrhea and chlamydia infection.  Will obtain pelvic ultrasound due to tenderness to rule out TOA.  Pelvic ultrasound negative.  Discussed findings and plan with patient.  As she is tender and having CMT, will treat for possible PID/cervicitis with Doxy.  Discussed importance of follow-up with OB/GYN, and if results are positive her partner will need to be treated and tested.  At this time, patient appears safe for discharge.  Return precautions given.  Patient states she understands agrees plan.   Final Clinical Impressions(s) / ED Diagnoses   Final diagnoses:  PID (acute pelvic inflammatory disease)    ED Discharge Orders         Ordered    doxycycline (VIBRAMYCIN) 100 MG capsule  2 times daily     06/15/18 1624           Athaliah Baumbach, PA-C 06/15/18 1745    Cathren Laine, MD 06/16/18 [phone]

## 2018-06-15 NOTE — ED Triage Notes (Signed)
PResents with dysuria for over one month, she reprots that she has been on 3 antibiotics including, Macrobid, Cipro and Augmentin. She endorses vaginal pian and redness as well. Denies fevers. She denies vaginal discharge.

## 2018-06-16 LAB — URINE CULTURE: Culture: NO GROWTH

## 2018-06-18 ENCOUNTER — Ambulatory Visit: Payer: BLUE CROSS/BLUE SHIELD | Admitting: Neurology

## 2018-06-18 LAB — GC/CHLAMYDIA PROBE AMP (~~LOC~~) NOT AT ARMC
Chlamydia: NEGATIVE
Neisseria Gonorrhea: NEGATIVE

## 2018-06-18 NOTE — Progress Notes (Signed)
Virtual Visit via Video Note The purpose of this virtual visit is to provide medical care while limiting exposure to the novel coronavirus.    Consent was obtained for video visit:  Yes.   Answered questions that patient had about telehealth interaction:  Yes.   I discussed the limitations, risks, security and privacy concerns of performing an evaluation and management service by telemedicine. I also discussed with the patient that there may be a patient responsible charge related to this service. The patient expressed understanding and agreed to proceed.  Pt location: Home Physician Location: office Name of referring provider:  Shirline FreesNafziger, Cory, NP I connected with Katie Alvarez at patients initiation/request on 06/19/2018 at  1:00 PM EDT by video enabled telemedicine application and verified that I am speaking with the correct person using two identifiers. Pt MRN:  [id number] Pt DOB:  December 07, 1995 Video Participants:  Katie Alvarez   History of Present Illness:  Katie FetchKelley Alvarez is a [age] year old right-handed female who follows up for migraines.  UPDATE: Started on topiramate which was titrated up to 100mg  at bedtime Intensity:  Moderate to severe Duration:  30 minutes to 2 hours Frequency:  Once a month (severe migraine occurs once every 3 to 6 months) Frequency of abortive medication: once a month Current NSAIDS:  ibuprofen Current analgesics:  Tylenol Current triptans:  sumatriptan 100mg  Current ergotamine:  none Current anti-emetic:  none Current muscle relaxants:  none Current anti-anxiolytic:  none Current sleep aide:  none Current Antihypertensive medications:  none Current Antidepressant medications:  Citalopram 10mg  Current Anticonvulsant medications:  topiramate 100mg  daily Current anti-CGRP:  none Current Vitamins/Herbal/Supplements:  D Current Antihistamines/Decongestants:  none Other therapy:  none IUD:  Kyleena  Caffeine:  20 oz Dr. Reino KentPepper most days, occasional  coffee Alcohol:  yes Smoker:  1 cigarette a week. Diet: 32 oz water daily, does not skip meals Exercise:  yes Depression:  Yes; Anxiety:  yes Other pain:  Knee pain Sleep hygiene:  varies  HISTORY: Onset:  childhood Location:  Usually behind both eyes (predominantly left) and radiates down to the jaw or back of head Quality:  Pressure, sharp Initial Intensity:  7/10.  She denies new headache, thunderclap headache or severe headache that wakes her from sleep. Aura:  no Prodrome:  no Postdrome:  Feels off for 1 to 2 days (fatigue) Associated symptoms:  Nausea, vomiting, photophobia, phonophobia, brief specks in vision when she blinks.  She denies associated unilateral numbness or weakness. Initial Duration:  1 to 2 days Initial Frequency:  Some sort of headache 5-6 days a month.  Mild headache once every 2 weeks, moderate one once a month, severe one every 3 to 6 months (requiring ED visit) Frequency of abortive medication: takes Tylenol or ibuprofen daily Triggers:  Hunger/skipped meals, strong scents, MSG Relieving factors.  sleep, cold shower Activity:  Can't function  Past NSAIDS:  none Past analgesics:  none Past abortive triptans:  none Past abortive ergotamine:  none Past muscle relaxants:  none Past anti-emetic:  none Past antihypertensive medications:  none Past antidepressant medications:  none Past anticonvulsant medications:  none Past anti-CGRP:  none Past vitamins/Herbal/Supplements:  riboflavin Past antihistamines/decongestants:  none Other past therapies:  none  Family history of headache:  Mom has migraines  Past Medical History: Past Medical History:  Diagnosis Date  . Band keratopathy of both eyes   . Hypertension 11/20/2013  . Hypophosphatemia   . Migraines    with aura  . Mononucleosis   .  Mononucleosis, infectious, with hepatitis    per patient no hepatitis  . STD (sexually transmitted disease) 02/10/2017   chlamydia   . Vitamin D deficiency  disease     Medications: Outpatient Encounter Medications as of 06/19/2018  Medication Sig  . acetaminophen (TYLENOL) 500 MG tablet Take 500-1,000 mg by mouth every 6 (six) hours as needed for mild pain.   . cetirizine (ZYRTEC) 10 MG tablet Take 10 mg by mouth 2 (two) times daily.   . Cholecalciferol (VITAMIN D PO) Take 1 capsule by mouth daily.   . ciprofloxacin (CIPRO) 500 MG tablet Take 1 tablet (500 mg total) by mouth 2 (two) times daily.  . citalopram (CELEXA) 20 MG tablet TAKE 1 TABLET BY MOUTH EVERY DAY  . doxycycline (VIBRAMYCIN) 100 MG capsule Take 1 capsule (100 mg total) by mouth 2 (two) times daily for 14 days.  Marland Kitchen EPINEPHrine 0.3 mg/0.3 mL IJ SOAJ injection Inject 0.3 mLs (0.3 mg total) into the muscle once.  Marland Kitchen ibuprofen (ADVIL,MOTRIN) 800 MG tablet Take 1 tablet (800 mg total) by mouth every 8 (eight) hours as needed.  . Levonorgestrel (KYLEENA) 19.5 MG IUD by Intrauterine route.  . ranitidine (ZANTAC) 150 MG tablet Take 150 mg by mouth 2 (two) times daily.  . SUMAtriptan (IMITREX) 100 MG tablet TAKE 1 TAB AT ONSET OF MIGRAINE. MAY REPEAT IN 2 HRS IF HEADACHE PERSISTS OR RECURS. MAX 2 TAB/24 H  . topiramate (TOPAMAX) 100 MG tablet Take 1 tablet (100 mg total) by mouth daily.  Marland Kitchen triamcinolone cream (KENALOG) 0.1 % Apply 1 application topically 2 (two) times daily.   No facility-administered encounter medications on file as of 06/19/2018.     Allergies: Allergies  Allergen Reactions  . Sulfa Antibiotics Hives and Shortness Of Breath  . Dust Mite Extract Hives    Family History: Family History  Problem Relation Age of Onset  . Diabetes Mother   . Obesity Mother   . Asthma Mother   . Skin cancer Mother   . Thyroid disease Maternal Aunt   . Thyroid disease Maternal Grandmother   . Osteoporosis Maternal Grandmother   . Skin cancer Maternal Grandmother   . Asthma Maternal Uncle   . Heart attack Father   . Bipolar disorder Brother   . Asthma Brother   . Heart attack  Maternal Grandfather     Social History: Social History   Socioeconomic History  . Marital status: Single    Spouse name: Not on file  . Number of children: Not on file  . Years of education: Not on file  . Highest education level: Some college, no degree  Occupational History  . 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 Financial resource strain: Not on file  . Food insecurity:    Worry: Not on file    Inability: Not on file  . Transportation needs:    Medical: Not on file    Non-medical: Not on file  Tobacco Use  . Smoking status: Current Every Day Smoker    Types: Cigarettes  . Smokeless tobacco: Never Used  Substance and Sexual Activity  . Alcohol use: Yes    Alcohol/week: 0.0 standard drinks    Comment: wine, coctails  . Drug use: No  . Sexual activity: Yes    Partners: Male    Birth control/protection: I.U.D.  Lifestyle  . Physical activity:    Days per week: Not on file    Minutes per session: Not on  file  . Stress: Not on file  Relationships  . Social connections:    Talks on phone: Not on file    Gets together: Not on file    Attends religious service: Not on file    Active member of club or organization: Not on file    Attends meetings of clubs or organizations: Not on file    Relationship status: Not on file  . Intimate partner violence:    Fear of current or ex partner: Not on file    Emotionally abused: Not on file    Physically abused: Not on file    Forced sexual activity: Not on file  Other Topics Concern  . Not on file  Social History Narrative   Lives with parents and brother. In 8th grade. Plays volleyball (in school and rec), in spanish club. Wants to go to college and be a child abuse counselor.      Patient is right-handed. She lives with her parents in a one level home. She drinks 20 oz of soda a day and occasionally tea and coffee.   Observations/Objective:   Blood pressure 117/72, pulse 68, temperature 98.8 F  (37.1 C), height 5\' 4"  (1.626 m), weight 189 lb (85.7 kg). No acute distress.  Alert and oriented.  Speech fluent and not dysarthric.  Language intact.  Face symmetric.  Assessment and Plan:   Migraine without aura, without status migrainosus, not intractable, improved  1.  For preventative management, topiramate 100mg  daily 2.  For abortive therapy, sumatriptan 100mg  3.  Limit use of pain relievers to no more than 2 days out of week to prevent risk of rebound or medication-overuse headache. 4.  Keep headache diary 5.  Exercise, hydration, caffeine cessation, sleep hygiene, monitor for and avoid triggers 6.  Consider:  magnesium citrate 400mg  daily, riboflavin 400mg  daily, and coenzyme Q10 100mg  three times daily 7. Always keep in mind that currently taking a hormone or birth control may be a possible trigger or aggravating factor for migraine. 8. Follow up 6 months.  Follow Up Instructions:    -I discussed the assessment and treatment plan with the patient. The patient was provided an opportunity to ask questions and all were answered. The patient agreed with the plan and demonstrated an understanding of the instructions.   The patient was advised to call back or seek an in-person evaluation if the symptoms worsen or if the condition fails to improve as anticipated.   Cira Servant, DO

## 2018-06-19 ENCOUNTER — Encounter: Payer: Self-pay | Admitting: Neurology

## 2018-06-19 ENCOUNTER — Telehealth (INDEPENDENT_AMBULATORY_CARE_PROVIDER_SITE_OTHER): Payer: BLUE CROSS/BLUE SHIELD | Admitting: Neurology

## 2018-06-19 ENCOUNTER — Other Ambulatory Visit: Payer: Self-pay

## 2018-06-19 ENCOUNTER — Ambulatory Visit: Payer: BLUE CROSS/BLUE SHIELD | Admitting: Neurology

## 2018-06-19 VITALS — BP 117/72 | HR 68 | Temp 98.8°F | Ht 64.0 in | Wt 189.0 lb

## 2018-06-19 DIAGNOSIS — G43009 Migraine without aura, not intractable, without status migrainosus: Secondary | ICD-10-CM | POA: Diagnosis not present

## 2018-07-19 ENCOUNTER — Telehealth: Payer: Self-pay | Admitting: Neurology

## 2018-07-19 MED ORDER — SUMATRIPTAN SUCCINATE 100 MG PO TABS: ORAL_TABLET | ORAL | 2 refills | Status: DC

## 2018-07-19 NOTE — Telephone Encounter (Signed)
Pt needs to have a refill on imitrex called in to the [facility] on [address] she would also like to have a couple of refills with this RX

## 2018-07-21 ENCOUNTER — Other Ambulatory Visit: Payer: Self-pay | Admitting: Neurology

## 2018-07-29 ENCOUNTER — Other Ambulatory Visit: Payer: Self-pay | Admitting: Adult Health

## 2018-08-03 IMAGING — US US TRANSVAGINAL NON-OB
1 series · 14 of 25 positions shown · non-contrast
Comparison: None

CLINICAL DATA: Elevated prolactin, midpelvic pain today. Patient
had IUD placed 1 week ago.

EXAM:
TRANSABDOMINAL AND TRANSVAGINAL ULTRASOUND OF PELVIS
TECHNIQUE: Both transabdominal and transvaginal ultrasound examinations of the
pelvis were performed. Transabdominal technique was performed for
global imaging of the pelvis including uterus, ovaries, adnexal
regions, and pelvic cul-de-sac. It was necessary to proceed with
endovaginal exam following the transabdominal exam to visualize the
uterus and ovaries..

[Series 1: us transvaginal non-ob · 0.20mm/px · 14 of 43 slices shown]
[im 1/43]
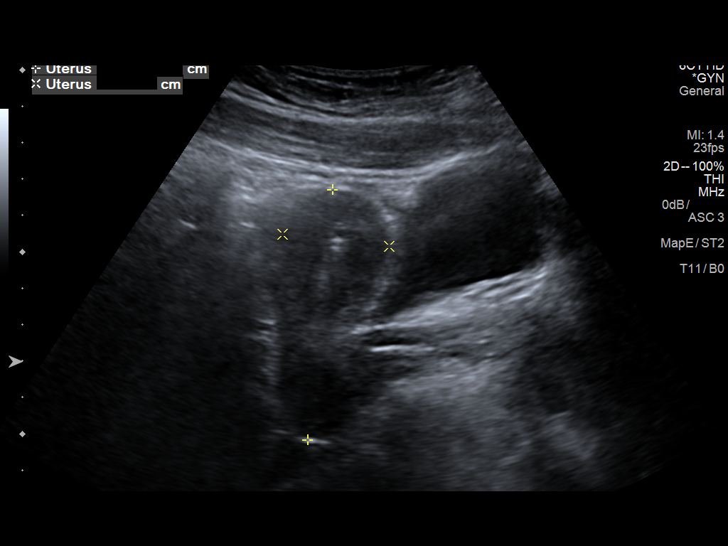
[im 4/43]
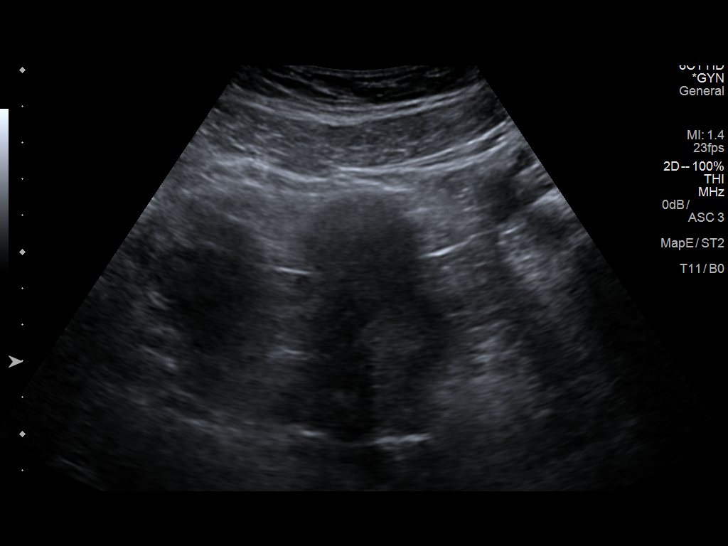
[im 8/43]
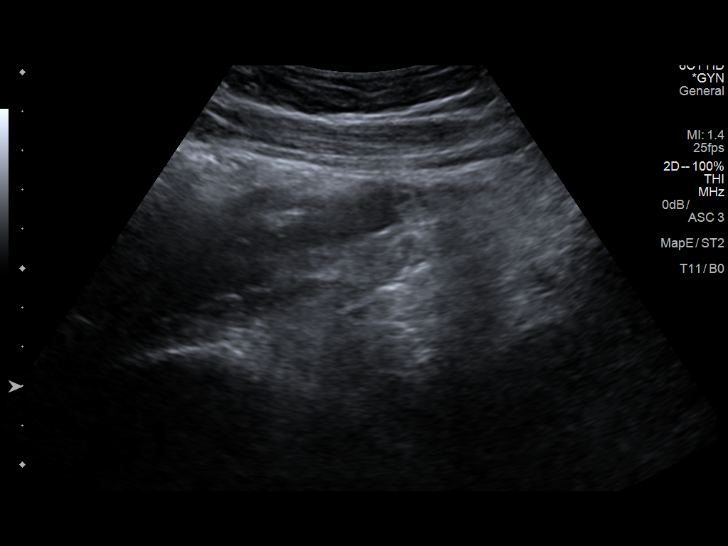
[im 11/43]
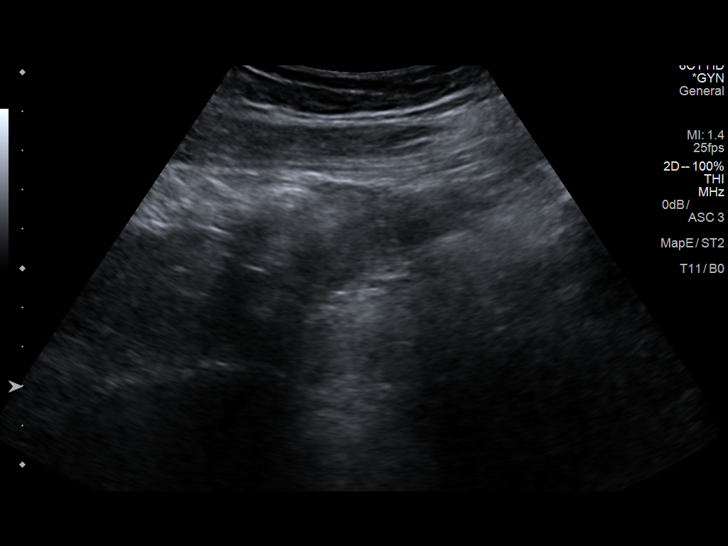
[im 15/43]
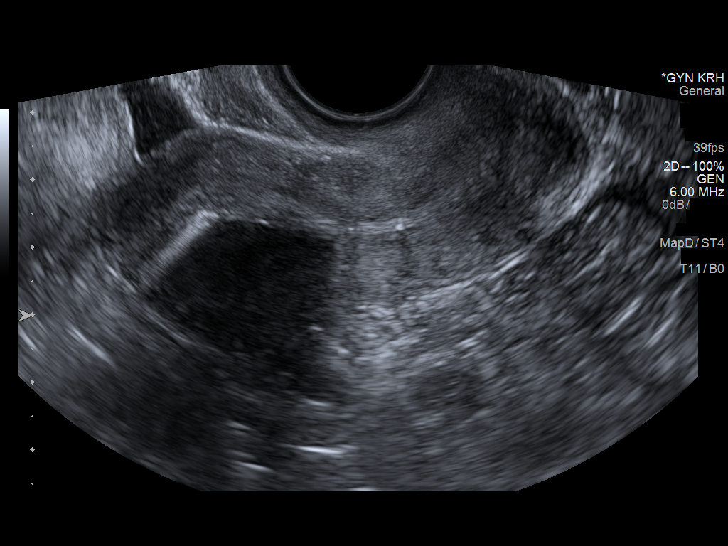
[im 16/43]
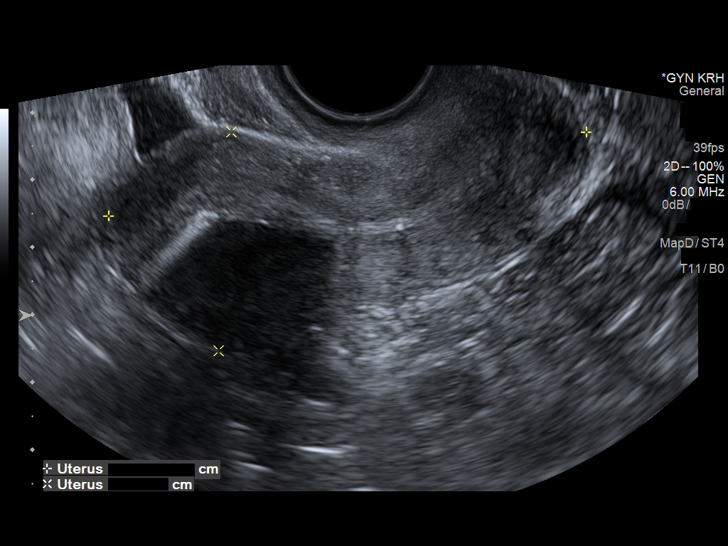
[im 20/43]
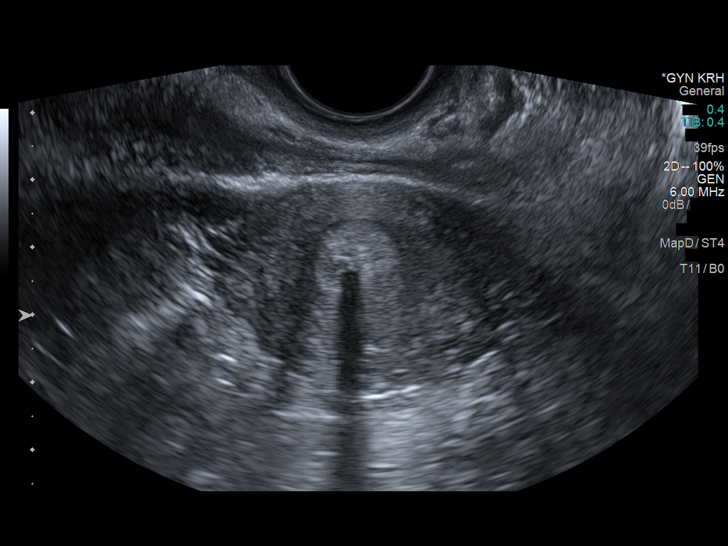
[im 23/43]
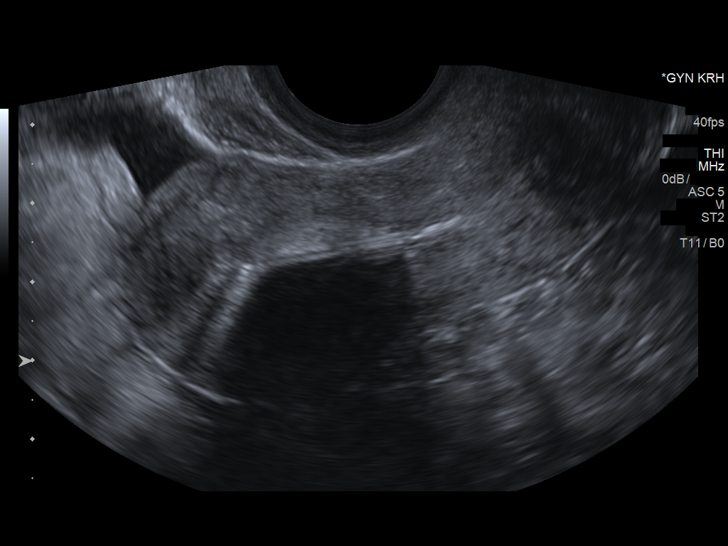
[im 27/43]
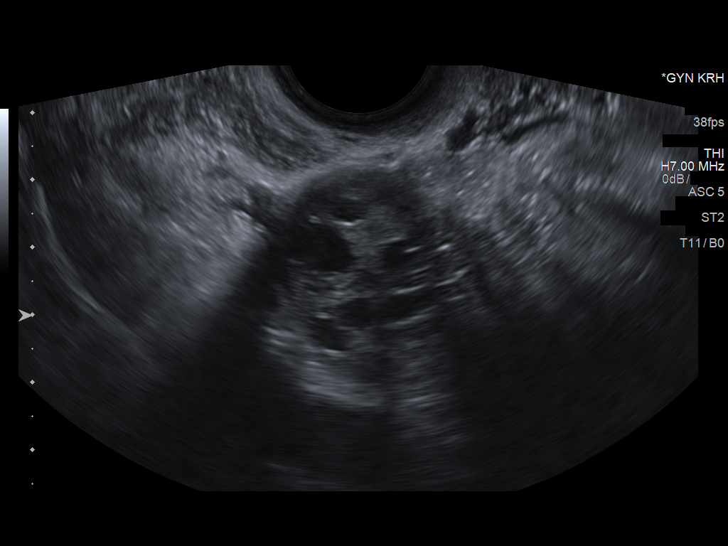
[im 29/43]
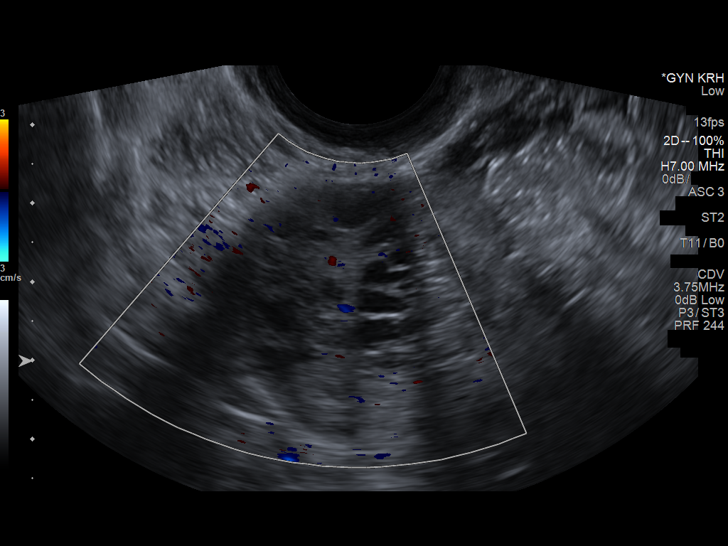
[im 32/43]
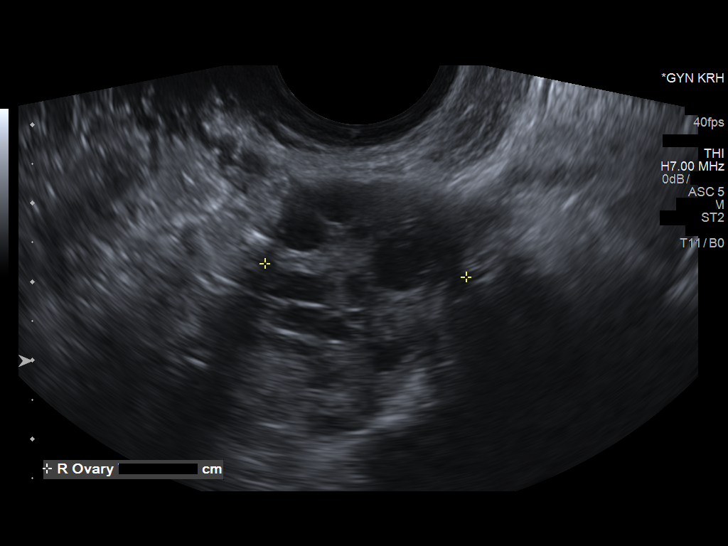
[im 36/43]
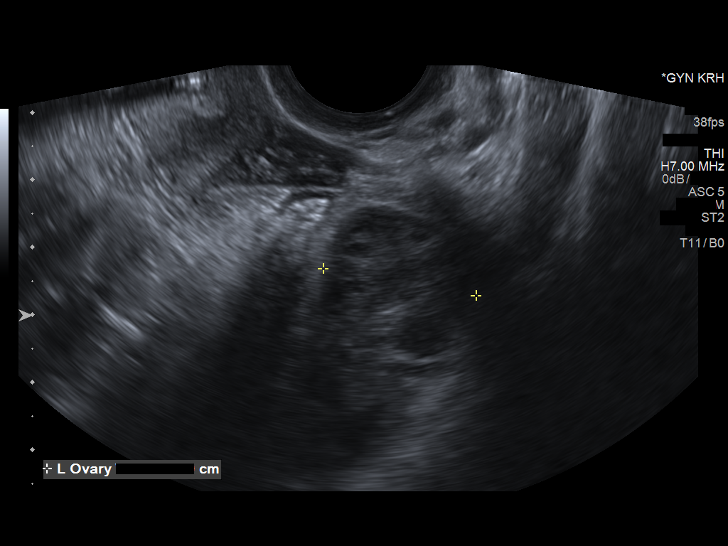
[im 39/43]
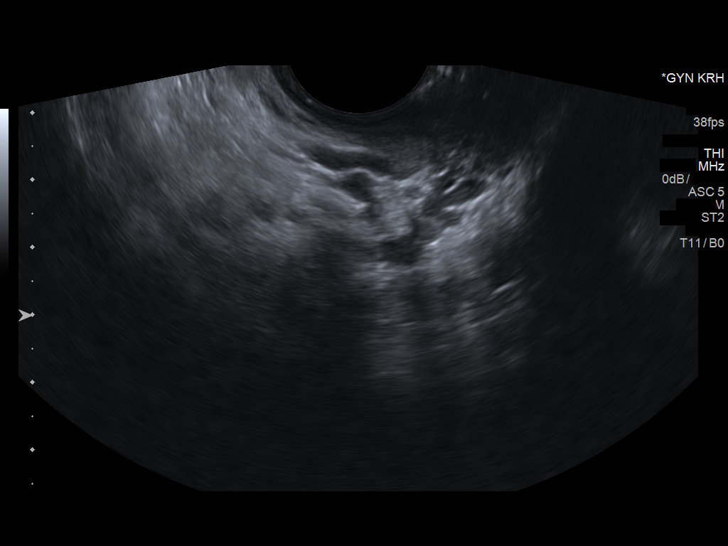
[im 43/43]
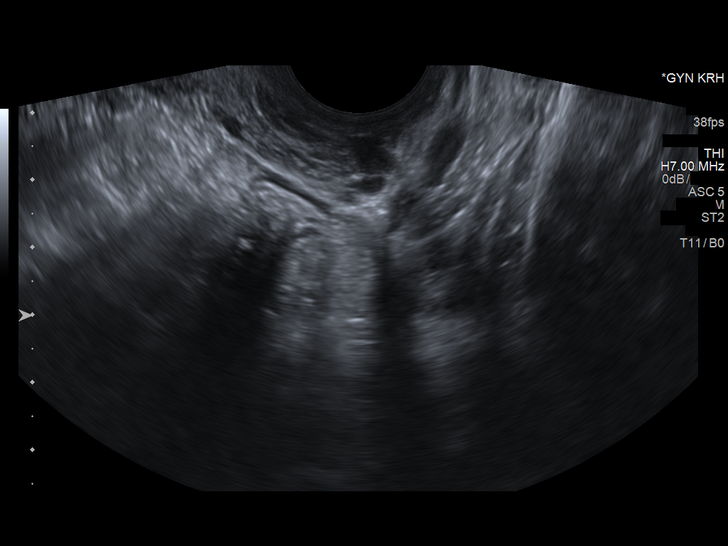

[14 of 25 positions shown; findings below may reference images not displayed]

FINDINGS: Uterus

Measurements: 7.2 x 3.3 x 4.2 cm. No fibroids or other mass
visualized. The both the and cyst is identified in the cervix.

Endometrium

Thickness: 7 mm. IUD is identified in good position. No focal
abnormality visualized.

Right ovary

Measurements: 3.2 x 2.2 x 2.6 cm. Normal appearance. Small follicles
are noted.

Left ovary

Measurements: 4.3 x 2.6 x 2.3 cm. Normal appearance. Small follicles
are noted.

Other findings

Small amount of free fluid is identified.
IMPRESSION: IUD in good position.  No acute abnormality noted.

Small free fluid identified in the pelvis, likely physiologic.

## 2018-08-29 ENCOUNTER — Other Ambulatory Visit: Payer: Self-pay

## 2018-08-29 ENCOUNTER — Telehealth (INDEPENDENT_AMBULATORY_CARE_PROVIDER_SITE_OTHER): Payer: BC Managed Care – PPO | Admitting: Neurology

## 2018-08-29 ENCOUNTER — Encounter: Payer: Self-pay | Admitting: Neurology

## 2018-08-29 VITALS — Temp 98.4°F | Ht 64.0 in | Wt 190.0 lb

## 2018-08-29 DIAGNOSIS — G43009 Migraine without aura, not intractable, without status migrainosus: Secondary | ICD-10-CM | POA: Diagnosis not present

## 2018-08-29 MED ORDER — TOPIRAMATE 100 MG PO TABS: 150.0000 mg | ORAL_TABLET | Freq: Every day | ORAL | 3 refills | Status: DC

## 2018-08-29 NOTE — Patient Instructions (Signed)
1.  I will increase your topiramate to 150mg  at bedtime.  Contact me in 6 weeks if headaches not improved. 2.  Continue sumatriptan as directed.      To Whom It May Concern:  I Ms. Sexson for migraines.  As heat is a trigger for her migraines, I recommend that Ms. Pasillas live in a dorm or room with air conditioning.  Metta Clines, DO [facility] [place], [address], [place] [address] Phone:  [phone] Fax: [phone]

## 2018-08-29 NOTE — Progress Notes (Signed)
Virtual Visit via Video Note The purpose of this virtual visit is to provide medical care while limiting exposure to the novel coronavirus.    Consent was obtained for video visit:  Yes.   Answered questions that patient had about telehealth interaction:  Yes.   I discussed the limitations, risks, security and privacy concerns of performing an evaluation and management service by telemedicine. I also discussed with the patient that there may be a patient responsible charge related to this service. The patient expressed understanding and agreed to proceed.  Pt location: Home Physician Location: office Name of referring provider:  Shirline FreesNafziger, Cory, NP I connected with Renato GailsKelley A Falkner at patients initiation/request on 08/29/2018 at 10:30 AM EDT by video enabled telemedicine application and verified that I am speaking with the correct person using two identifiers. Pt MRN:  [id number] Pt DOB:  [birth year] Video Participants:  Renato GailsKelley A Vincelette;   History of Present Illness:  Katie Alvarez is a 23 year old right-handedfemale who follows up for migraines.  UPDATE: She reports increased migraines over the past month.  She thinks that it's wearing the mask at work (makes her feel anxious, uncomfortable and feeling hot.)  It often happens at work. Intensity:  Moderate to severe Duration:  30 minutes to 2 hours Frequency:  She had one daily for a week.  About 10 days total over past month. Frequency of abortive medication: once a month Current NSAIDS:ibuprofen Current analgesics:Tylenol Current triptans:sumatriptan 100mg  Current ergotamine:none Current anti-emetic:none Current muscle relaxants:none Current anti-anxiolytic:none Current sleep aide:none Current Antihypertensive medications:none Current Antidepressant medications:Citalopram 10mg  Current Anticonvulsant medications:topiramate 100mg  daily Current anti-CGRP:none Current Vitamins/Herbal/Supplements:D Current  Antihistamines/Decongestants:none Other therapy:none IUD: Kyleena  Caffeine:20 oz Dr. Reino KentPepper most days, occasional coffee Alcohol:yes Smoker:1 cigarette a week. Diet:32 oz water daily, does not skip meals Exercise:yes Depression:Yes;Anxiety: yes Other pain:Knee pain Sleep hygiene:varies  HISTORY: Onset:  Childhood Location:Usually behind both eyes (predominantly left) and radiates down to the jaw or back of head Quality:Pressure, sharp Initial Intensity:7/10.Shedenies new headache, thunderclap headache or severe headache that wakes herfrom sleep. Aura:no Prodrome:no Postdrome:Feels off for 1 to 2 days (fatigue) Associated symptoms:  Nausea, vomiting, photophobia, phonophobia, brief specks in vision when she blinks.Shedenies associated unilateral numbness or weakness. Initial Duration:1 to 2 days Initial Frequency:Some sort of headache 5-6 days a month. Mild headache once every 2 weeks, moderate one once a month, severe one every 3 to 6 months (requiring ED visit) Frequency of abortive medication:takes Tylenol or ibuprofen daily Triggers:  Hunger/skipped meals, strong scents, MSG Relieving factors: Sleep, cold shower Activity:Can't function  Past NSAIDS:none Past analgesics:none Past abortive triptans:none Past abortive ergotamine:none Past muscle relaxants:none Past anti-emetic:none Past antihypertensive medications:none Past antidepressant medications:none Past anticonvulsant medications:none Past anti-CGRP:none Past vitamins/Herbal/Supplements:riboflavin Past antihistamines/decongestants:none Other past therapies:none  Family history of headache:Mom has migraines  Past Medical History: Past Medical History:  Diagnosis Date  . Band keratopathy of both eyes   . Hypertension 11/20/2013  . Hypophosphatemia   . Migraines    with aura  . Mononucleosis   . Mononucleosis, infectious, with  hepatitis    per patient no hepatitis  . STD (sexually transmitted disease) 02/10/2017   chlamydia   . Vitamin D deficiency disease     Medications: Outpatient Encounter Medications as of 08/29/2018  Medication Sig  . acetaminophen (TYLENOL) 500 MG tablet Take 500-1,000 mg by mouth every 6 (six) hours as needed for mild pain.   . cetirizine (ZYRTEC) 10 MG tablet Take 10 mg by mouth 2 (two) times  daily.   . Cholecalciferol (VITAMIN D PO) Take 1 capsule by mouth daily.   . citalopram (CELEXA) 20 MG tablet TAKE 1 TABLET BY MOUTH EVERY DAY  . EPINEPHrine 0.3 mg/0.3 mL IJ SOAJ injection Inject 0.3 mLs (0.3 mg total) into the muscle once.  Marland Kitchen. ibuprofen (ADVIL,MOTRIN) 800 MG tablet Take 1 tablet (800 mg total) by mouth every 8 (eight) hours as needed.  . Levonorgestrel (KYLEENA) 19.5 MG IUD by Intrauterine route.  . SUMAtriptan (IMITREX) 100 MG tablet TAKE 1 TAB AT ONSET OF MIGRAINE. MAY REPEAT IN 2 HRS IF HEADACHE PERSISTS OR RECURS. MAX 2 TAB/24 H  . topiramate (TOPAMAX) 100 MG tablet TAKE 1 TABLET BY MOUTH EVERY DAY  . triamcinolone cream (KENALOG) 0.1 % Apply 1 application topically 2 (two) times daily.   No facility-administered encounter medications on file as of 08/29/2018.     Allergies: Allergies  Allergen Reactions  . Sulfa Antibiotics Hives and Shortness Of Breath  . Dust Mite Extract Hives    Family History: Family History  Problem Relation Age of Onset  . Diabetes Mother   . Obesity Mother   . Asthma Mother   . Skin cancer Mother   . Thyroid disease Maternal Aunt   . Thyroid disease Maternal Grandmother   . Osteoporosis Maternal Grandmother   . Skin cancer Maternal Grandmother   . Asthma Maternal Uncle   . Heart attack Father   . Bipolar disorder Brother   . Asthma Brother   . Heart attack Maternal Grandfather     Social History: Social History   Socioeconomic History  . Marital status: Single    Spouse name: Not on file  . Number of children: Not on file  .  Years of education: Not on file  . Highest education level: Some college, no degree  Occupational History  . Occupation: Consulting civil engineertudent    Comment: Physicist, [employer]  Social Needs  . Financial resource strain: Not on file  . Food insecurity    Worry: Not on file    Inability: Not on file  . Transportation needs    Medical: Not on file    Non-medical: Not on file  Tobacco Use  . Smoking status: Former Smoker    Types: Cigarettes    Quit date: 04/18/2018    Years since quitting: 0.3  . Smokeless tobacco: Never Used  Substance and Sexual Activity  . Alcohol use: Yes    Alcohol/week: 0.0 standard drinks    Comment: wine, coctails  . Drug use: No  . Sexual activity: Yes    Partners: Male    Birth control/protection: I.U.D.  Lifestyle  . Physical activity    Days per week: Not on file    Minutes per session: Not on file  . Stress: Not on file  Relationships  . Social Musicianconnections    Talks on phone: Not on file    Gets together: Not on file    Attends religious service: Not on file    Active member of club or organization: Not on file    Attends meetings of clubs or organizations: Not on file    Relationship status: Not on file  . Intimate partner violence    Fear of current or ex partner: Not on file    Emotionally abused: Not on file    Physically abused: Not on file    Forced sexual activity: Not on file  Other Topics Concern  . Not on file  Social History Narrative   Lives  with parents and brother. In 8th grade. Plays volleyball (in school and rec), in [employer] club. Wants to go to college and be a child abuse counselor.      Patient is right-handed. She lives with her parents in a one level home. She drinks 20 oz of soda a day and occasionally tea and coffee.    Observations/Objective:   Temperature 98.4 F (36.9 C), height 5\' 4"  (1.626 m), weight 190 lb (86.2 kg). No acute distress.  Alert and oriented.  Speech fluent and not dysarthric.  Language intact.  Eyes orthophoric on  primary gaze.  Face symmetric.  Assessment and Plan:   Migraine without aura, without status migrainosus, not intractable  1.  For preventative management, we will increase topiramate to 150mg  at bedtime.  If headaches not improved in 6 weeks, she will contact us. 2.  For abortive therapy, sumatriptan 100mg  3.  Limit use of pain relievers to no more than 2 days out of week to prevent risk of rebound or medication-overuse headache. 4.  Keep headache diary 5.  Exercise, hydration, caffeine cessation, sleep hygiene, monitor for and avoid triggers 6.  Consider:  magnesium citrate 400mg  daily, riboflavin 400mg  daily, and coenzyme Q10 100mg  three times daily 7. Always keep in mind that currently taking a hormone or birth control may be a possible trigger or aggravating factor for migraine. 8. Follow up 3 to 4 months.   Follow Up Instructions:    -I discussed the assessment and treatment plan with the patient. The patient was provided an opportunity to ask questions and all were answered. The patient agreed with the plan and demonstrated an understanding of the instructions.   The patient was advised to call back or seek an in-person evaluation if the symptoms worsen or if the condition fails to improve as anticipated.   Dudley Major, DO

## 2018-10-28 ENCOUNTER — Other Ambulatory Visit: Payer: Self-pay | Admitting: Neurology

## 2018-11-21 NOTE — Progress Notes (Signed)
Virtual Visit via Video Note The purpose of this virtual visit is to provide medical care while limiting exposure to the novel coronavirus.    Consent was obtained for video visit:  Yes.   Answered questions that patient had about telehealth interaction:  Yes.   I discussed the limitations, risks, security and privacy concerns of performing an evaluation and management service by telemedicine. I also discussed with the patient that there may be a patient responsible charge related to this service. The patient expressed understanding and agreed to proceed.  Pt location: Home Physician Location: office Name of referring provider:  Shirline Frees, NP I connected with Katie Alvarez at patients initiation/request on 11/26/2018 at  8:30 AM EDT by video enabled telemedicine application and verified that I am speaking with the correct person using two identifiers. Pt MRN:  [id number] Pt DOB:  12-07-1995 Video Participants:  Katie Alvarez   History of Present Illness:  Katie Alvarez is a 23 year oldright-handedfemale whofollows up for migraines.  UPDATE: Topiramate was increased to 150mg  at bedtime.  She reports increased side effects so after 3 weeks she went back down to 100mg  at bedtime.  Headaches have actually improved (she attributed it to stress) Intensity:  Moderate Duration:  30 to 60 minutes Frequency:  1 migraine over past 30 days. Frequency of abortive medication:once a month Current NSAIDS:ibuprofen Current analgesics:Tylenol Current triptans:sumatriptan 100mg  Current ergotamine:none Current anti-emetic:none Current muscle relaxants:none Current anti-anxiolytic:none Current sleep aide:none Current Antihypertensive medications:none Current Antidepressant medications:Citalopram 10mg  Current Anticonvulsant medications:topiramate 100mg daily Current anti-CGRP:none Current Vitamins/Herbal/Supplements:D Current Antihistamines/Decongestants:none  Other therapy:none IUD: Kyleena  Caffeine:Decreased Dr. intake; 1 to 2 ice coffees a week Smoker:1 cigarette a week. Diet:increased water intake, does not skip meals Exercise:yes.  Plays volleyball Depression:Yes;Anxiety: yes Other pain:broke foot.  Pain controlled. Sleep hygiene:good.  HISTORY: Onset:  Childhood Location:Usually behind both eyes (predominantly left) and radiates down to the jaw or back of head Quality:Pressure, sharp InitialIntensity:7/10.Shedenies new headache, thunderclap headache or severe headache that wakes herfrom sleep. Aura:no Prodrome:no Postdrome:Feels off for 1 to 2 days (fatigue) Associated symptoms: Nausea, vomiting, photophobia, phonophobia, brief specks in vision when she blinks.Shedenies associated unilateral numbness or weakness. InitialDuration:1 to 2 days InitialFrequency:Some sort of headache 5-6 days a month. Mild headache once every 2 weeks, moderate one once a month, severe one every 3 to 6 months (requiring ED visit) Frequency of abortive medication:takes Tylenol or ibuprofen daily Triggers:  Hunger/skipped meals, strong scents, MSG Relieving factors: Sleep, cold shower Activity:Can't function  Past NSAIDS:none Past analgesics:none Past abortive triptans:none Past abortive ergotamine:none Past muscle relaxants:none Past anti-emetic:none Past antihypertensive medications:none Past antidepressant medications:none Past anticonvulsant medications:none Past anti-CGRP:none Past vitamins/Herbal/Supplements:riboflavin Past antihistamines/decongestants:none Other past therapies:none  Family history of headache:Mom has migraines  Past Medical History: Past Medical History:  Diagnosis Date  . Band keratopathy of both eyes   . Hypertension 11/20/2013  . Hypophosphatemia   . Migraines    with aura  . Mononucleosis   . Mononucleosis, infectious,  with hepatitis    per patient no hepatitis  . STD (sexually transmitted disease) 02/10/2017   chlamydia   . Vitamin D deficiency disease     Medications: Outpatient Encounter Medications as of 11/26/2018  Medication Sig  . acetaminophen (TYLENOL) 500 MG tablet Take 500-1,000 mg by mouth every 6 (six) hours as needed for mild pain.   . cetirizine (ZYRTEC) 10 MG tablet Take 10 mg by mouth 2 (two) times daily.   . Cholecalciferol (VITAMIN D PO) Take  1 capsule by mouth daily.   . citalopram (CELEXA) 20 MG tablet TAKE 1 TABLET BY MOUTH EVERY DAY  . EPINEPHrine 0.3 mg/0.3 mL IJ SOAJ injection Inject 0.3 mLs (0.3 mg total) into the muscle once.  . etonogestrel (NEXPLANON) 68 MG IMPL implant Nexplanon 68 mg subdermal implant  Inject 1 implant by subcutaneous route.  Marland Kitchen ibuprofen (ADVIL,MOTRIN) 800 MG tablet Take 1 tablet (800 mg total) by mouth every 8 (eight) hours as needed.  . SUMAtriptan (IMITREX) 100 MG tablet TAKE 1 TAB AT ONSET OF MIGRAINE. MAY REPEAT IN 2 HRS IF HEADACHE PERSISTS OR RECURS. MAX 2 TAB/24  . topiramate (TOPAMAX) 100 MG tablet Take 1.5 tablets (150 mg total) by mouth at bedtime.  . triamcinolone cream (KENALOG) 0.1 % Apply 1 application topically 2 (two) times daily.  . valACYclovir (VALTREX) 1000 MG tablet TAKE 1 TABLET BY MOUTH EVERY 12 HOURS FOR 10 DAYS   No facility-administered encounter medications on file as of 11/26/2018.     Allergies: Allergies  Allergen Reactions  . Sulfa Antibiotics Hives and Shortness Of Breath  . Dust Mite Extract Hives    Family History: Family History  Problem Relation Age of Onset  . Diabetes Mother   . Obesity Mother   . Asthma Mother   . Skin cancer Mother   . Thyroid disease Maternal Aunt   . Thyroid disease Maternal Grandmother   . Osteoporosis Maternal Grandmother   . Skin cancer Maternal Grandmother   . Asthma Maternal Uncle   . Heart attack Father   . Bipolar disorder Brother   . Asthma Brother   . Heart attack  Maternal Grandfather     Social History: Social History   Socioeconomic History  . Marital status: Single    Spouse name: Not on file  . Number of children: Not on file  . Years of education: Not on file  . Highest education level: Some college, no degree  Occupational History  . Occupation: Ship broker    Comment: Engineer, site  . Financial resource strain: Not on file  . Food insecurity    Worry: Not on file    Inability: Not on file  . Transportation needs    Medical: Not on file    Non-medical: Not on file  Tobacco Use  . Smoking status: Former Smoker    Types: Cigarettes    Quit date: 04/18/2018    Years since quitting: 0.5  . Smokeless tobacco: Never Used  Substance and Sexual Activity  . Alcohol use: Yes    Alcohol/week: 0.0 standard drinks    Comment: wine, coctails  . Drug use: No  . Sexual activity: Yes    Partners: Male    Birth control/protection: I.U.D.  Lifestyle  . Physical activity    Days per week: Not on file    Minutes per session: Not on file  . Stress: Not on file  Relationships  . Social Herbalist on phone: Not on file    Gets together: Not on file    Attends religious service: Not on file    Active member of club or organization: Not on file    Attends meetings of clubs or organizations: Not on file    Relationship status: Not on file  . Intimate partner violence    Fear of current or ex partner: Not on file    Emotionally abused: Not on file    Physically abused: Not on file    Forced  sexual activity: Not on file  Other Topics Concern  . Not on file  Social History Narrative   Lives with parents and brother. In 8th grade. Plays volleyball (in school and rec), in [place] club. Wants to go to college and be a child abuse counselor.      Patient is right-handed. She lives with her parents in a one level home. She drinks 20 oz of soda a day and occasionally tea and coffee.    Observations/Objective:   Height 5\' 5"  (1.651  m), weight 190 lb (86.2 kg). No acute distress.  Alert and oriented.  Speech fluent and not dysarthric.  Language intact.  Eyes orthophoric on primary gaze.  Face symmetric.  Assessment and Plan:   Migraine without aura, without status migrainosus, not intractable  1.  For preventative management, topiramate 100mg  at bedtime 2.  For abortive therapy, sumatriptan 100mg  3.  Limit use of pain relievers to no more than 2 days out of week to prevent risk of rebound or medication-overuse headache. 4.  Keep headache diary 5.  Exercise, hydration, caffeine cessation, sleep hygiene, monitor for and avoid triggers 6.  Consider:  magnesium citrate 400mg  daily, riboflavin 400mg  daily, and coenzyme Q10 100mg  three times daily 7. Follow up 6 months   Follow Up Instructions:    -I discussed the assessment and treatment plan with the patient. The patient was provided an opportunity to ask questions and all were answered. The patient agreed with the plan and demonstrated an understanding of the instructions.   The patient was advised to call back or seek an in-person evaluation if the symptoms worsen or if the condition fails to improve as anticipated.   Cira ServantAdam Robert Jaffe, DO

## 2018-11-26 ENCOUNTER — Telehealth (INDEPENDENT_AMBULATORY_CARE_PROVIDER_SITE_OTHER): Payer: BC Managed Care – PPO | Admitting: Neurology

## 2018-11-26 ENCOUNTER — Encounter: Payer: Self-pay | Admitting: Neurology

## 2018-11-26 ENCOUNTER — Other Ambulatory Visit: Payer: Self-pay

## 2018-11-26 VITALS — Ht 65.0 in | Wt 190.0 lb

## 2018-11-26 DIAGNOSIS — G43009 Migraine without aura, not intractable, without status migrainosus: Secondary | ICD-10-CM

## 2018-11-26 NOTE — Patient Instructions (Signed)
1.  For preventative management, topiramate 100mg  at bedtime 2.  For abortive therapy, sumatriptan 100mg  3.  Limit use of pain relievers to no more than 2 days out of week to prevent risk of rebound or medication-overuse headache. 4.  Keep headache diary 5.  Exercise, hydration, caffeine cessation, sleep hygiene, monitor for and avoid triggers 6.  Consider:  magnesium citrate 400mg  daily, riboflavin 400mg  daily, and coenzyme Q10 100mg  three times daily 7. Follow up 6 months

## 2018-12-04 ENCOUNTER — Other Ambulatory Visit: Payer: Self-pay

## 2018-12-04 ENCOUNTER — Telehealth (INDEPENDENT_AMBULATORY_CARE_PROVIDER_SITE_OTHER): Payer: BC Managed Care – PPO | Admitting: Adult Health

## 2018-12-04 DIAGNOSIS — F419 Anxiety disorder, unspecified: Secondary | ICD-10-CM

## 2018-12-04 MED ORDER — CITALOPRAM HYDROBROMIDE 40 MG PO TABS: 40.0000 mg | ORAL_TABLET | Freq: Every day | ORAL | 1 refills | Status: DC

## 2018-12-04 MED ORDER — CLONAZEPAM 0.5 MG PO TABS: 0.5000 mg | ORAL_TABLET | Freq: Two times a day (BID) | ORAL | 1 refills | Status: DC | PRN

## 2018-12-04 NOTE — Progress Notes (Signed)
Virtual Visit via Video Note  I connected with Katie Alvarez on 12/04/18 at  1:30 PM EST by a video enabled telemedicine application and verified that I am speaking with the correct person using two identifiers.  Location patient: home Location provider:work or home office Persons participating in the virtual visit: patient, provider  I discussed the limitations of evaluation and management by telemedicine and the availability of in person appointments. The patient expressed understanding and agreed to proceed.   HPI: 23 year old female who is being evaluated today for anxiety.  She is currently prescribed Celexa 20 mg and has done well with this over the last few months.  Unfortunately over the last few weeks her anxiety has increased.  She reports waking up almost every night of the week feeling as though she is having a panic attack.  During the day she also feels as her anxiety is causing her to feel like her heart is beating out of her chest.  She denies causative factor, reports that she is doing well in school, is enjoying being back on campus.  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  Denies SI    ROS: See pertinent positives and negatives per HPI.  Past Medical History:  Diagnosis Date  . Band keratopathy of both eyes   . Hypertension 11/20/2013  . Hypophosphatemia   . Migraines    with aura  . Mononucleosis   . Mononucleosis, infectious, with hepatitis    per patient no hepatitis  . STD (sexually transmitted disease) 02/10/2017   chlamydia   . Vitamin D deficiency disease     Past Surgical History:  Procedure Laterality Date  . ANTERIOR CRUCIATE LIGAMENT REPAIR Bilateral    2013 & 2015  . INTRAUTERINE DEVICE INSERTION  01/2017   Kyleena   . liletta iud     inserted 11-23-15  . OTHER SURGICAL HISTORY Right 2013   ACL reconstruction  times 2  . WISDOM TOOTH EXTRACTION      Family History  Problem Relation Age of Onset  . Diabetes Mother   . Obesity Mother   . Asthma Mother   . Skin cancer Mother   . Thyroid disease Maternal Aunt   . Thyroid disease Maternal Grandmother   . Osteoporosis Maternal Grandmother   . Skin cancer Maternal Grandmother   . Asthma Maternal Uncle   . Heart attack Father   . Bipolar disorder Brother   . Asthma Brother   . Heart attack Maternal Grandfather      Current Outpatient Medications:  .  acetaminophen (TYLENOL) 500 MG tablet, Take 500-1,000 mg by mouth every 6 (six) hours as needed for mild pain. , Disp: , Rfl:  .  cetirizine (ZYRTEC) 10 MG tablet, Take 10 mg by mouth 2 (two) times daily. , Disp: , Rfl:  .  Cholecalciferol (VITAMIN D PO), Take 1 capsule by mouth daily. , Disp: , Rfl:  .  citalopram (CELEXA) 20 MG tablet, TAKE 1 TABLET BY MOUTH EVERY DAY, Disp: 90 tablet, Rfl: 1 .  EPINEPHrine 0.3 mg/0.3 mL IJ SOAJ injection, Inject 0.3 mLs (0.3 mg total) into the muscle once., Disp: 1 Device, Rfl: 0 .  etonogestrel (NEXPLANON) 68 MG IMPL implant, Nexplanon 68 mg subdermal implant  Inject 1 implant by subcutaneous route., Disp: , Rfl:  .  ibuprofen (ADVIL,MOTRIN) 800 MG tablet,  Take 1 tablet (800 mg total) by mouth every 8 (eight) hours as needed., Disp: 30 tablet, Rfl: 1 .  SUMAtriptan (IMITREX) 100 MG tablet, TAKE 1 TAB AT ONSET OF MIGRAINE. MAY REPEAT IN 2 HRS IF HEADACHE PERSISTS OR RECURS. MAX 2 TAB/24, Disp: 10 tablet, Rfl: 2 .  topiramate (TOPAMAX) 100 MG tablet, Take 1.5 tablets (150 mg total) by mouth at bedtime., Disp: 45 tablet, Rfl: 3 .  triamcinolone cream (KENALOG) 0.1 %, Apply 1 application topically 2 (two) times daily., Disp: 45 g, Rfl: 2 .  valACYclovir (VALTREX) 1000 MG tablet, TAKE 1 TABLET BY MOUTH EVERY 12 HOURS FOR 10 DAYS, Disp: , Rfl:   EXAM:  VITALS per patient if applicable:  GENERAL: alert, oriented, appears well and in no acute distress  HEENT:  atraumatic, conjunttiva clear, no obvious abnormalities on inspection of external nose and ears  NECK: normal movements of the head and neck  LUNGS: on inspection no signs of respiratory distress, breathing rate appears normal, no obvious gross SOB, gasping or wheezing  CV: no obvious cyanosis  MS: moves all visible extremities without noticeable abnormality  PSYCH/NEURO: pleasant and cooperative, no obvious depression or anxiety, speech and thought processing grossly intact  ASSESSMENT AND PLAN:  Discussed the following assessment and plan:  1. Anxiety -Increase Celexa from 20 mg to 40 mg.  Will prescribe a short course of Klonopin to help with anxiety.  She was advised to take this twice daily as needed.  She was notified that this medication may cause drowsiness.  Follow-up if anxiety is not better controlled in the next 30 days - clonazePAM (KLONOPIN) 0.5 MG tablet; Take 1 tablet (0.5 mg total) by mouth 2 (two) times daily as needed for anxiety.  Dispense: 20 tablet; Refill: 1 - citalopram (CELEXA) 40 MG tablet; Take 1 tablet (40 mg total) by mouth daily.  Dispense: 90 tablet; Refill: 1     I discussed the assessment and treatment plan with the patient. The patient was provided an opportunity to ask questions and all were answered. The patient agreed with the plan and demonstrated an understanding of the instructions.   The patient was advised to call back or seek an in-person evaluation if the symptoms worsen or if the condition fails to improve as anticipated.   Shirline Frees, NP

## 2018-12-25 ENCOUNTER — Ambulatory Visit: Payer: BLUE CROSS/BLUE SHIELD | Admitting: Neurology

## 2019-02-25 ENCOUNTER — Other Ambulatory Visit: Payer: Self-pay | Admitting: Neurology

## 2019-02-25 ENCOUNTER — Other Ambulatory Visit: Payer: Self-pay | Admitting: Adult Health

## 2019-03-19 ENCOUNTER — Encounter: Payer: Self-pay | Admitting: Adult Health

## 2019-03-19 ENCOUNTER — Telehealth (INDEPENDENT_AMBULATORY_CARE_PROVIDER_SITE_OTHER): Payer: BC Managed Care – PPO | Admitting: Adult Health

## 2019-03-19 ENCOUNTER — Other Ambulatory Visit: Payer: Self-pay

## 2019-03-19 DIAGNOSIS — N3001 Acute cystitis with hematuria: Secondary | ICD-10-CM | POA: Diagnosis not present

## 2019-03-19 MED ORDER — NITROFURANTOIN MONOHYD MACRO 100 MG PO CAPS: 100.0000 mg | ORAL_CAPSULE | Freq: Two times a day (BID) | ORAL | 0 refills | Status: DC

## 2019-03-19 NOTE — Progress Notes (Signed)
Virtual Visit via Video Note  I connected with Katie Alvarez on 03/19/19 at  1:00 PM EST by a video enabled telemedicine application and verified that I am speaking with the correct person using two identifiers.  Location patient: home Location provider:work or home office Persons participating in the virtual visit: patient, provider  I discussed the limitations of evaluation and management by telemedicine and the availability of in person appointments. The patient expressed understanding and agreed to proceed.   HPI: 24 year old female who is being evaluated today for an acute issue concern for urinary tract infection.  She reports that her symptoms started yesterday.  Her symptoms include dysuria, hematuria, urinary frequency and urgency.  She denies low back pain, fevers, chills, or feeling acutely ill.  This is her second urinary tract infection this year.  She does report wearing synthetic underwear and has not been staying hydrated   ROS: See pertinent positives and negatives per HPI.  Past Medical History:  Diagnosis Date  . Band keratopathy of both eyes   . Hypertension 11/20/2013  . Hypophosphatemia   . Migraines    with aura  . Mononucleosis   . Mononucleosis, infectious, with hepatitis    per patient no hepatitis  . STD (sexually transmitted disease) 02/10/2017   chlamydia   . Vitamin D deficiency disease     Past Surgical History:  Procedure Laterality Date  . ANTERIOR CRUCIATE LIGAMENT REPAIR Bilateral    2013 & 2015  . INTRAUTERINE DEVICE INSERTION  01/2017   Kyleena   . liletta iud     inserted 11-23-15  . OTHER SURGICAL HISTORY Right 2013   ACL reconstruction times 2  . WISDOM TOOTH EXTRACTION      Family History  Problem Relation Age of Onset  . Diabetes Mother   . Obesity Mother   . Asthma Mother   . Skin cancer Mother   . Thyroid disease Maternal Aunt   . Thyroid disease Maternal Grandmother   . Osteoporosis Maternal Grandmother   . Skin cancer  Maternal Grandmother   . Asthma Maternal Uncle   . Heart attack Father   . Bipolar disorder Brother   . Asthma Brother   . Heart attack Maternal Grandfather        Current Outpatient Medications:  .  acetaminophen (TYLENOL) 500 MG tablet, Take 500-1,000 mg by mouth every 6 (six) hours as needed for mild pain. , Disp: , Rfl:  .  cetirizine (ZYRTEC) 10 MG tablet, Take 10 mg by mouth 2 (two) times daily. , Disp: , Rfl:  .  Cholecalciferol (VITAMIN D PO), Take 1 capsule by mouth daily. , Disp: , Rfl:  .  citalopram (CELEXA) 20 MG tablet, TAKE 1 TABLET BY MOUTH EVERY DAY, Disp: 90 tablet, Rfl: 1 .  citalopram (CELEXA) 40 MG tablet, Take 1 tablet (40 mg total) by mouth daily., Disp: 90 tablet, Rfl: 1 .  EPINEPHrine 0.3 mg/0.3 mL IJ SOAJ injection, Inject 0.3 mLs (0.3 mg total) into the muscle once., Disp: 1 Device, Rfl: 0 .  etonogestrel (NEXPLANON) 68 MG IMPL implant, Nexplanon 68 mg subdermal implant  Inject 1 implant by subcutaneous route., Disp: , Rfl:  .  ibuprofen (ADVIL,MOTRIN) 800 MG tablet, Take 1 tablet (800 mg total) by mouth every 8 (eight) hours as needed., Disp: 30 tablet, Rfl: 1 .  SUMAtriptan (IMITREX) 100 MG tablet, TAKE 1 TAB AT ONSET OF MIGRAINE. MAY REPEAT IN 2 HRS IF HEADACHE PERSISTS OR RECURS. MAX 2 TAB/24, Disp: 10 tablet,  Rfl: 2 .  topiramate (TOPAMAX) 100 MG tablet, TAKE 1 TABLET BY MOUTH EVERY DAY, Disp: 90 tablet, Rfl: 1 .  triamcinolone cream (KENALOG) 0.1 %, Apply 1 application topically 2 (two) times daily., Disp: 45 g, Rfl: 2 .  valACYclovir (VALTREX) 1000 MG tablet, TAKE 1 TABLET BY MOUTH EVERY 12 HOURS FOR 10 DAYS, Disp: , Rfl:  .  nitrofurantoin, macrocrystal-monohydrate, (MACROBID) 100 MG capsule, Take 1 capsule (100 mg total) by mouth 2 (two) times daily., Disp: 10 capsule, Rfl: 0  EXAM:  VITALS per patient if applicable:  GENERAL: alert, oriented, appears well and in no acute distress  HEENT: atraumatic, conjunttiva clear, no obvious abnormalities on  inspection of external nose and ears  NECK: normal movements of the head and neck  LUNGS: on inspection no signs of respiratory distress, breathing rate appears normal, no obvious gross SOB, gasping or wheezing  CV: no obvious cyanosis  MS: moves all visible extremities without noticeable abnormality  PSYCH/NEURO: pleasant and cooperative, no obvious depression or anxiety, speech and thought processing grossly intact  ASSESSMENT AND PLAN:  Discussed the following assessment and plan:  1. Acute cystitis with hematuria -Encourage cotton underwear and to stay well-hydrated.  Will treat for suspected urinary tract infection with Macrobid. - nitrofurantoin, macrocrystal-monohydrate, (MACROBID) 100 MG capsule; Take 1 capsule (100 mg total) by mouth 2 (two) times daily.  Dispense: 10 capsule; Refill: 0      I discussed the assessment and treatment plan with the patient. The patient was provided an opportunity to ask questions and all were answered. The patient agreed with the plan and demonstrated an understanding of the instructions.   The patient was advised to call back or seek an in-person evaluation if the symptoms worsen or if the condition fails to improve as anticipated.   Dorothyann Peng, NP

## 2019-03-29 ENCOUNTER — Other Ambulatory Visit: Payer: Self-pay | Admitting: Adult Health

## 2019-03-29 DIAGNOSIS — F419 Anxiety disorder, unspecified: Secondary | ICD-10-CM

## 2019-03-29 MED ORDER — CLONAZEPAM 0.5 MG PO TABS: 0.5000 mg | ORAL_TABLET | Freq: Two times a day (BID) | ORAL | 1 refills | Status: DC | PRN

## 2019-03-29 NOTE — Telephone Encounter (Signed)
Spoke to the pt.  She needed refills of clonazepam.  Will forward to [facility].

## 2019-03-29 NOTE — Telephone Encounter (Signed)
Medication Refill: Kobazepam   Pharmacy: [facility] [address] [place] Phone:  Could not locate medication on pt list unsure of spelling if this could be confirmed with pt.   Patient Phone: [phone]

## 2019-04-01 ENCOUNTER — Telehealth: Payer: Self-pay | Admitting: Neurology

## 2019-04-01 ENCOUNTER — Other Ambulatory Visit: Payer: Self-pay | Admitting: *Deleted

## 2019-04-01 MED ORDER — SUMATRIPTAN SUCCINATE 100 MG PO TABS: ORAL_TABLET | ORAL | 2 refills | Status: DC

## 2019-04-01 NOTE — Telephone Encounter (Signed)
Refilled Rx sumatriptan 100mg --[facility]

## 2019-04-01 NOTE — Telephone Encounter (Signed)
Patient needs a refill on her sumatriptan called in to the [facility] @ [phone]

## 2019-04-10 ENCOUNTER — Encounter: Payer: BC Managed Care – PPO | Admitting: Adult Health

## 2019-04-11 ENCOUNTER — Telehealth (INDEPENDENT_AMBULATORY_CARE_PROVIDER_SITE_OTHER): Payer: BC Managed Care – PPO | Admitting: Adult Health

## 2019-04-11 ENCOUNTER — Encounter: Payer: Self-pay | Admitting: Adult Health

## 2019-04-11 DIAGNOSIS — F32A Depression, unspecified: Secondary | ICD-10-CM

## 2019-04-11 DIAGNOSIS — F419 Anxiety disorder, unspecified: Secondary | ICD-10-CM | POA: Diagnosis not present

## 2019-04-11 DIAGNOSIS — F329 Major depressive disorder, single episode, unspecified: Secondary | ICD-10-CM

## 2019-04-11 MED ORDER — CLONAZEPAM 0.5 MG PO TABS: 0.5000 mg | ORAL_TABLET | Freq: Two times a day (BID) | ORAL | 2 refills | Status: DC

## 2019-04-11 NOTE — Addendum Note (Signed)
Addended by: Raj Janus T on: 04/11/2019 03:19 PM   Modules accepted: Orders

## 2019-04-11 NOTE — Progress Notes (Signed)
Virtual Visit via Video Note  I connected with Katie Alvarez on 04/11/19 at  2:00 PM EST by a video enabled telemedicine application and verified that I am speaking with the correct person using two identifiers.  Location patient: home Location provider:work or home office Persons participating in the virtual visit: patient, provider  I discussed the limitations of evaluation and management by telemedicine and the availability of in person appointments. The patient expressed understanding and agreed to proceed.   HPI:  24 year old female who is being evaluated today for follow-up regarding anxiety and depression.  We last spoke back in November 2020 her anxiety had increased.  She reported waking up almost every night of the week feeling as though she was having a panic attack.  During the day she felt as though her anxiety was causing her to feel like her heart was beating out of her chest.  At this time Celexa was increased to 40 mg and she was prescribed a short course of Klonopin 0.5 mg to take twice daily as needed.  She reports today that the increase in the Celexa is definitely helped with her depression and has helped somewhat with anxiety.  Unfortunately, she continues to have panic attacks and finds that the Klonopin helps her gets relief.  She feels as though most of her anxiety stems from being back at school and getting ready for graduation, [place] she is keeping an eye on her 49 year old brother who has mental health issues.  She is asking to have a refill of the Klonopin she can take as needed for anxiety attacks  ROS: See pertinent positives and negatives per HPI.  Past Medical History:  Diagnosis Date  . Band keratopathy of both eyes   . Hypertension 11/20/2013  . Hypophosphatemia   . Migraines    with aura  . Mononucleosis   . Mononucleosis, infectious, with hepatitis    per patient no hepatitis  . STD  (sexually transmitted disease) 02/10/2017   chlamydia   . Vitamin D deficiency disease     Past Surgical History:  Procedure Laterality Date  . ANTERIOR CRUCIATE LIGAMENT REPAIR Bilateral    2013 & 2015  . INTRAUTERINE DEVICE INSERTION  01/2017   Kyleena   . liletta iud     inserted 11-23-15  . OTHER SURGICAL HISTORY Right 2013   ACL reconstruction times 2  . WISDOM TOOTH EXTRACTION      Family History  Problem Relation Age of Onset  . Diabetes Mother   . Obesity Mother   . Asthma Mother   . Skin cancer Mother   . Thyroid disease Maternal Aunt   . Thyroid disease Maternal Grandmother   . Osteoporosis Maternal Grandmother   . Skin cancer Maternal Grandmother   . Asthma Maternal Uncle   . Heart attack Father   . Bipolar disorder Brother   . Asthma Brother   . Heart attack Maternal Grandfather        Current Outpatient Medications:  .  acetaminophen (TYLENOL) 500 MG tablet, Take 500-1,000 mg by mouth every 6 (six) hours as needed for mild pain. , Disp: , Rfl:  .  cetirizine (ZYRTEC) 10 MG tablet, Take 10 mg by mouth 2 (two) times daily. , Disp: , Rfl:  .  Cholecalciferol (VITAMIN D PO), Take 1 capsule by mouth daily. , Disp: , Rfl:  .  citalopram (CELEXA) 20 MG tablet, TAKE 1 TABLET  BY MOUTH EVERY DAY, Disp: 90 tablet, Rfl: 1 .  citalopram (CELEXA) 40 MG tablet, Take 1 tablet (40 mg total) by mouth daily., Disp: 90 tablet, Rfl: 1 .  clonazePAM (KLONOPIN) 0.5 MG tablet, Take 1 tablet (0.5 mg total) by mouth 2 (two) times daily as needed for anxiety., Disp: 20 tablet, Rfl: 1 .  EPINEPHrine 0.3 mg/0.3 mL IJ SOAJ injection, Inject 0.3 mLs (0.3 mg total) into the muscle once., Disp: 1 Device, Rfl: 0 .  etonogestrel (NEXPLANON) 68 MG IMPL implant, Nexplanon 68 mg subdermal implant  Inject 1 implant by subcutaneous route., Disp: , Rfl:  .  ibuprofen (ADVIL,MOTRIN) 800 MG tablet, Take 1 tablet (800 mg total) by mouth every 8 (eight) hours as needed., Disp: 30 tablet, Rfl: 1 .   nitrofurantoin, macrocrystal-monohydrate, (MACROBID) 100 MG capsule, Take 1 capsule (100 mg total) by mouth 2 (two) times daily., Disp: 10 capsule, Rfl: 0 .  SUMAtriptan (IMITREX) 100 MG tablet, TAKE 1 TAB AT ONSET OF MIGRAINE. MAY REPEAT IN 2 HRS IF HEADACHE PERSISTS OR RECURS. MAX 2 TAB/24, Disp: 10 tablet, Rfl: 2 .  topiramate (TOPAMAX) 100 MG tablet, TAKE 1 TABLET BY MOUTH EVERY DAY, Disp: 90 tablet, Rfl: 1 .  triamcinolone cream (KENALOG) 0.1 %, Apply 1 application topically 2 (two) times daily., Disp: 45 g, Rfl: 2 .  valACYclovir (VALTREX) 1000 MG tablet, TAKE 1 TABLET BY MOUTH EVERY 12 HOURS FOR 10 DAYS, Disp: , Rfl:   EXAM:  VITALS per patient if applicable:  GENERAL: alert, oriented, appears well and in no acute distress  HEENT: atraumatic, conjunttiva clear, no obvious abnormalities on inspection of external nose and ears  NECK: normal movements of the head and neck  LUNGS: on inspection no signs of respiratory distress, breathing rate appears normal, no obvious gross SOB, gasping or wheezing  CV: no obvious cyanosis  MS: moves all visible extremities without noticeable abnormality  PSYCH/NEURO: pleasant and cooperative, no obvious depression or anxiety, speech and thought processing grossly intact  ASSESSMENT AND PLAN:  Discussed the following assessment and plan:  1. Anxiety and depression -We will keep her on Celexa 40 mg as this is helping.  Will send in a new prescription for Klonopin, ideally when she graduates school she will no longer need this medication.  We did have a discussion about the the addictive nature of the medication and that I would not prescribe it long-term. - clonazePAM (KLONOPIN) 0.5 MG tablet; Take 1 tablet (0.5 mg total) by mouth 2 (two) times daily.  Dispense: 60 tablet; Refill: 2     I discussed the assessment and treatment plan with the patient. The patient was provided an opportunity to ask questions and all were answered. The patient agreed  with the plan and demonstrated an understanding of the instructions.   The patient was advised to call back or seek an in-person evaluation if the symptoms worsen or if the condition fails to improve as anticipated.   Dorothyann Peng, NP

## 2019-04-15 ENCOUNTER — Encounter: Payer: Self-pay | Admitting: Certified Nurse Midwife

## 2019-04-17 ENCOUNTER — Encounter: Payer: Self-pay | Admitting: Certified Nurse Midwife

## 2019-05-24 NOTE — Progress Notes (Signed)
Virtual Visit via Video Note The purpose of this virtual visit is to provide medical care while limiting exposure to the novel coronavirus.    Consent was obtained for video visit:  Yes.   Answered questions that patient had about telehealth interaction:  Yes.   I discussed the limitations, risks, security and privacy concerns of performing an evaluation and management service by telemedicine. I also discussed with the patient that there may be a patient responsible charge related to this service. The patient expressed understanding and agreed to proceed.  Pt location: Home Physician Location: office Name of referring provider:  Shirline Frees, NP I connected with Renato Gails at patients initiation/request on 05/27/2019 at  8:30 AM EDT by video enabled telemedicine application and verified that I am speaking with the correct person using two identifiers. Pt MRN:  [id number] Pt DOB:  1995-08-25 Video Participants:  Renato Gails   History of Present Illness:  Katie Alvarez is a 24 year oldright-handedfemale whofollows up for migraines.  UPDATE: Intensity:  Moderate Duration:  30 to 60 minutes Frequency:  less than 1 migraine a month.  She reports that her right eye has been twitching off and on everyday.  She reports sleep is good.  She has some more stress as she is graduating college and is looking to find a job.    Frequency of abortive medication:once a month Current NSAIDS:ibuprofen Current analgesics:Tylenol Current triptans:sumatriptan 100mg  Current ergotamine:none Current anti-emetic:none Current muscle relaxants:none Current anti-anxiolytic:none Current sleep aide:none Current Antihypertensive medications:none Current Antidepressant medications:Citalopram 10mg  Current Anticonvulsant medications:topiramate 100mg daily (150mg  caused increased side effects) Current anti-CGRP:none Current Vitamins/Herbal/Supplements:D Current  Antihistamines/Decongestants:none Other therapy:none IUD: Kyleena  Caffeine:Decreased Dr. intake; 1 to 2 ice coffees a week Smoker:1 cigarette a week. Diet:increased water intake, does not skip meals Exercise:yes.  Plays volleyball Depression:Yes;Anxiety: yes Other pain:broke foot.  Pain controlled. Sleep hygiene:good.  HISTORY: Onset:  Childhood Location:Usually behind both eyes (predominantly left) and radiates down to the jaw or back of head Quality:Pressure, sharp InitialIntensity:7/10.Shedenies new headache, thunderclap headache or severe headache that wakes herfrom sleep. Aura:no Prodrome:no Postdrome:Feels off for 1 to 2 days (fatigue) Associated symptoms:Nausea, vomiting, photophobia, phonophobia, brief specks in vision when she blinks.Shedenies associated unilateral numbness or weakness. InitialDuration:1 to 2 days InitialFrequency:Some sort of headache 5-6 days a month. Mild headache once every 2 weeks, moderate one once a month, severe one every 3 to 6 months (requiring ED visit) Frequency of abortive medication:takes Tylenol or ibuprofen daily Triggers:  Hunger/skipped meals, strong scents, MSG Relieving factors: Sleep, cold shower Activity:Can't function  Past NSAIDS:none Past analgesics:none Past abortive triptans:none Past abortive ergotamine:none Past muscle relaxants:none Past anti-emetic:none Past antihypertensive medications:none Past antidepressant medications:none Past anticonvulsant medications:none Past anti-CGRP:none Past vitamins/Herbal/Supplements:riboflavin Past antihistamines/decongestants:none Other past therapies:none  Family history of headache:Mom has migraines  Past Medical History: Past Medical History:  Diagnosis Date  . Band keratopathy of both eyes   . Hypertension 11/20/2013  . Hypophosphatemia   . Migraines    with aura  . Mononucleosis    . Mononucleosis, infectious, with hepatitis    per patient no hepatitis  . STD (sexually transmitted disease) 02/10/2017   chlamydia   . Vitamin D deficiency disease     Medications: Outpatient Encounter Medications as of 05/27/2019  Medication Sig  . acetaminophen (TYLENOL) 500 MG tablet Take 500-1,000 mg by mouth every 6 (six) hours as needed for mild pain.   . cetirizine (ZYRTEC) 10 MG tablet Take 10 mg by mouth 2 (two)  times daily.   . Cholecalciferol (VITAMIN D PO) Take 1 capsule by mouth daily.   . citalopram (CELEXA) 40 MG tablet Take 1 tablet (40 mg total) by mouth daily.  . clonazePAM (KLONOPIN) 0.5 MG tablet Take 1 tablet (0.5 mg total) by mouth 2 (two) times daily.  Marland Kitchen EPINEPHrine 0.3 mg/0.3 mL IJ SOAJ injection Inject 0.3 mLs (0.3 mg total) into the muscle once.  . etonogestrel (NEXPLANON) 68 MG IMPL implant Nexplanon 68 mg subdermal implant  Inject 1 implant by subcutaneous route.  Marland Kitchen ibuprofen (ADVIL,MOTRIN) 800 MG tablet Take 1 tablet (800 mg total) by mouth every 8 (eight) hours as needed.  . SUMAtriptan (IMITREX) 100 MG tablet TAKE 1 TAB AT ONSET OF MIGRAINE. MAY REPEAT IN 2 HRS IF HEADACHE PERSISTS OR RECURS. MAX 2 TAB/24  . topiramate (TOPAMAX) 100 MG tablet TAKE 1 TABLET BY MOUTH EVERY DAY  . triamcinolone cream (KENALOG) 0.1 % Apply 1 application topically 2 (two) times daily.  . valACYclovir (VALTREX) 1000 MG tablet TAKE 1 TABLET BY MOUTH EVERY 12 HOURS FOR 10 DAYS   No facility-administered encounter medications on file as of 05/27/2019.    Allergies: Allergies  Allergen Reactions  . Sulfa Antibiotics Hives and Shortness Of Breath  . Dust Mite Extract Hives    Family History: Family History  Problem Relation Age of Onset  . Diabetes Mother   . Obesity Mother   . Asthma Mother   . Skin cancer Mother   . Thyroid disease Maternal Aunt   . Thyroid disease Maternal Grandmother   . Osteoporosis Maternal Grandmother   . Skin cancer Maternal Grandmother   .  Asthma Maternal Uncle   . Heart attack Father   . Bipolar disorder Brother   . Asthma Brother   . Heart attack Maternal Grandfather     Social History: Social History   Socioeconomic History  . Marital status: Single    Spouse name: Not on file  . Number of children: 0  . Years of education: Not on file  . Highest education level: Some college, no degree  Occupational History  . Occupation: Student    Comment: Senior  Tobacco Use  . Smoking status: Former Smoker    Types: Cigarettes    Quit date: 04/18/2018    Years since quitting: 1.0  . Smokeless tobacco: Never Used  Substance and Sexual Activity  . Alcohol use: Yes    Alcohol/week: 0.0 standard drinks    Comment: wine, coctails  . Drug use: No  . Sexual activity: Yes    Partners: Male    Birth control/protection: I.U.D.  Other Topics Concern  . Not on file  Social History Narrative   Lives with parents and brother. In 8th grade. Plays volleyball (in school and rec), in spanish club. Wants to go to college and be a child abuse counselor.      Patient is right-handed. She lives with her parents in a one level home. She drinks 20 oz of soda a day and occasionally tea and coffee.   Social Determinants of Health   Financial Resource Strain:   . Difficulty of Paying Living Expenses:   Food Insecurity:   . Worried About Programme researcher, broadcasting/film/video in the Last Year:   . Barista in the Last Year:   Transportation Needs:   . Freight forwarder (Medical):   Marland Kitchen Lack of Transportation (Non-Medical):   Physical Activity:   . Days of Exercise per Week:   .  Minutes of Exercise per Session:   Stress:   . Feeling of Stress :   Social Connections:   . Frequency of Communication with Friends and Family:   . Frequency of Social Gatherings with Friends and Family:   . Attends Religious Services:   . Active Member of Clubs or Organizations:   . Attends Archivist Meetings:   Marland Kitchen Marital Status:   Intimate Partner  Violence:   . Fear of Current or Ex-Partner:   . Emotionally Abused:   Marland Kitchen Physically Abused:   . Sexually Abused:     Observations/Objective:   Height 5\' 5"  (1.651 m), weight 190 lb (86.2 kg). No acute distress.  Alert and oriented.  Speech fluent and not dysarthric.  Language intact.  Eyes orthophoric on primary gaze.  Face symmetric.  Assessment and Plan:   Migraine without aura, without status migrainosus, not intractable Eye twitching, likely related to stress.  1.  For preventative management, topiramate 100mg  at bedtime 2.  For abortive therapy, sumatriptan 100mg  3.  Limit use of pain relievers to no more than 2 days out of week to prevent risk of rebound or medication-overuse headache. 4.  Keep headache diary 5.  Exercise, hydration, caffeine cessation, sleep hygiene, monitor for and avoid triggers 6. Follow up one year   Follow Up Instructions:    -I discussed the assessment and treatment plan with the patient. The patient was provided an opportunity to ask questions and all were answered. The patient agreed with the plan and demonstrated an understanding of the instructions.   The patient was advised to call back or seek an in-person evaluation if the symptoms worsen or if the condition fails to improve as anticipated.    Dudley Major, DO

## 2019-05-26 IMAGING — US US PELVIS COMPLETE
1 series · 13 of 25 positions shown · non-contrast
Comparison: Pelvic ultrasound dated February 10, 2017.

CLINICAL DATA: Left adnexal tenderness.  Concern for TOA.

EXAM:
TRANSABDOMINAL AND TRANSVAGINAL ULTRASOUND OF PELVIS
TECHNIQUE: Both transabdominal and transvaginal ultrasound examinations of the
pelvis were performed. Transabdominal technique was performed for
global imaging of the pelvis including uterus, ovaries, adnexal
regions, and pelvic cul-de-sac. It was necessary to proceed with
endovaginal exam following the transabdominal exam to visualize the
endometrium and ovaries.

[Series 1: us pelvis complete · 13 of 98 slices shown]
[im 1/98]
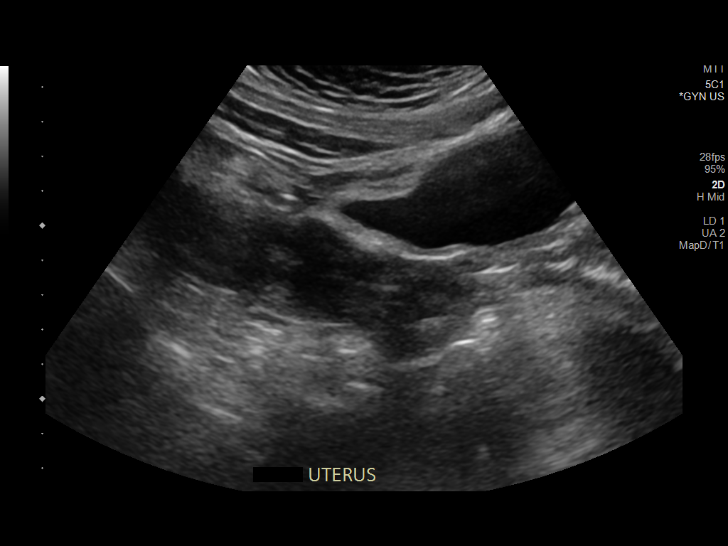
[im 9/98]
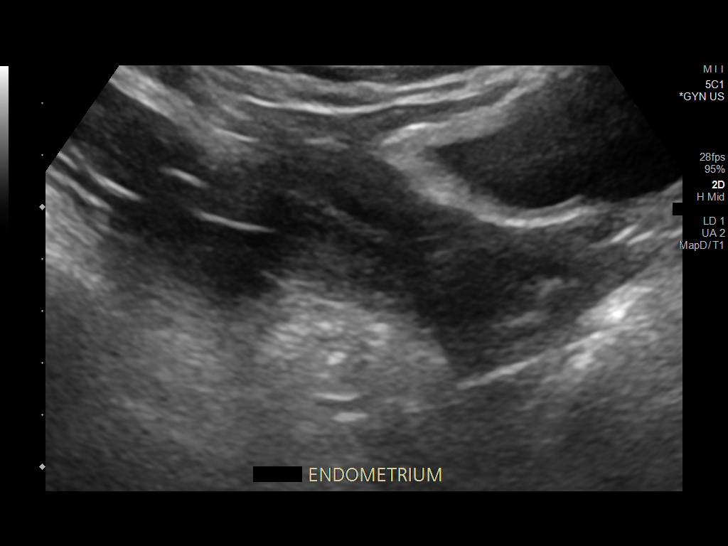
[im 17/98]
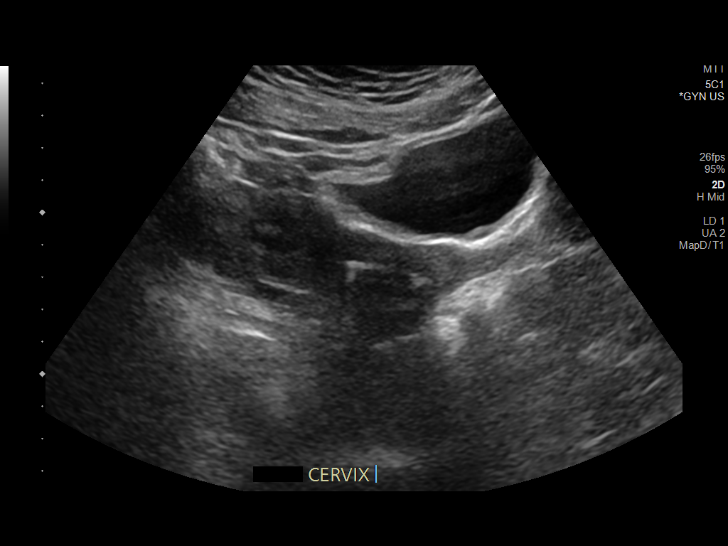
[im 25/98]
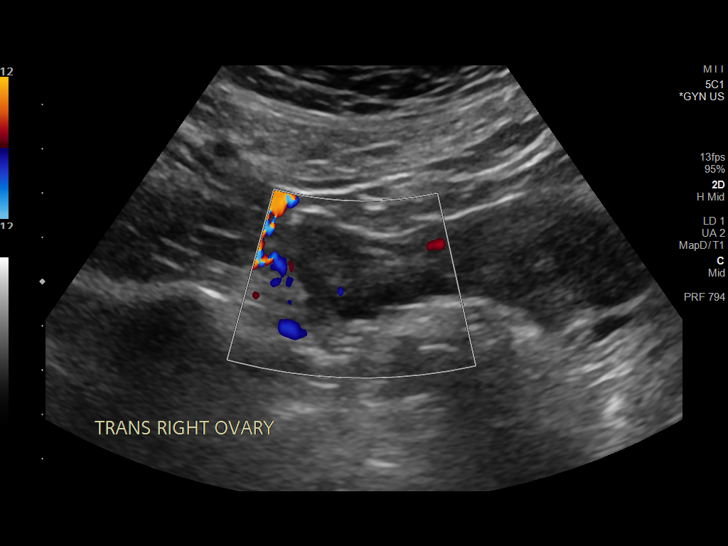
[im 33/98]
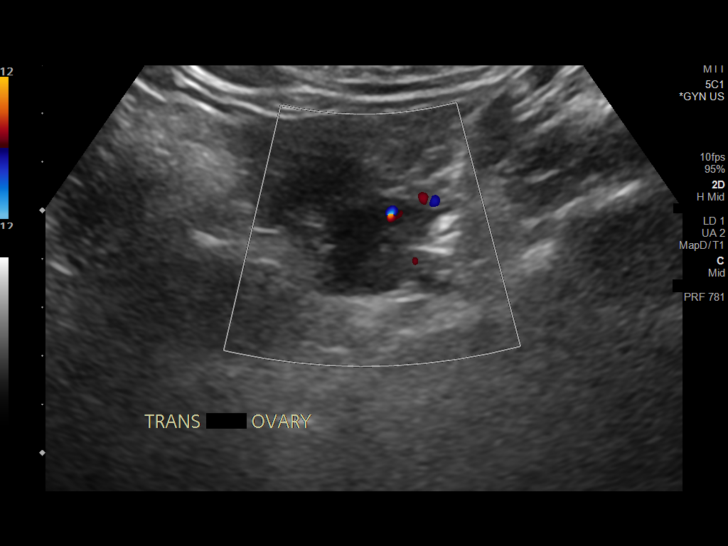
[im 41/98]
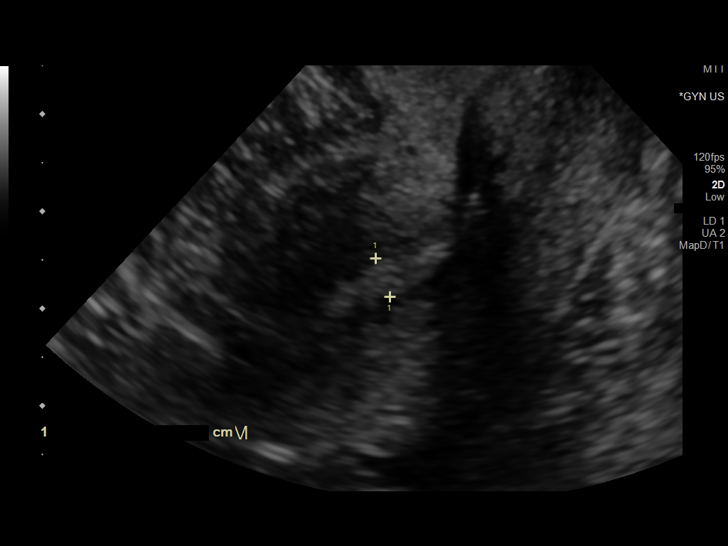
[im 49/98]
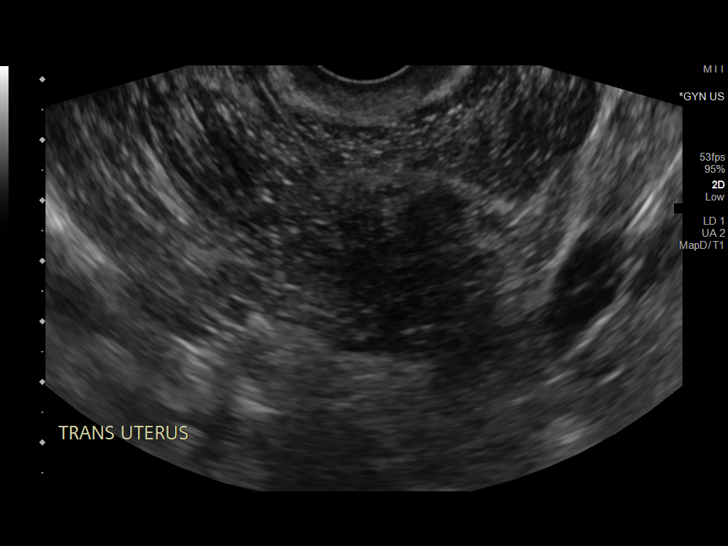
[im 57/98]
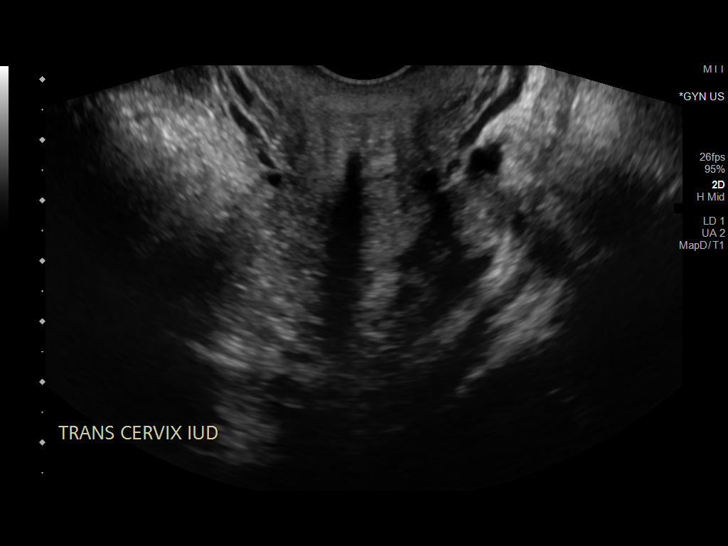
[im 65/98]
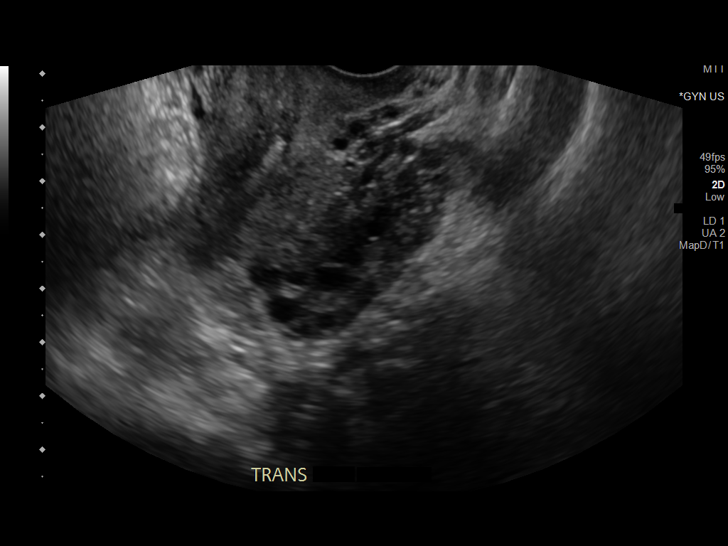
[im 73/98]
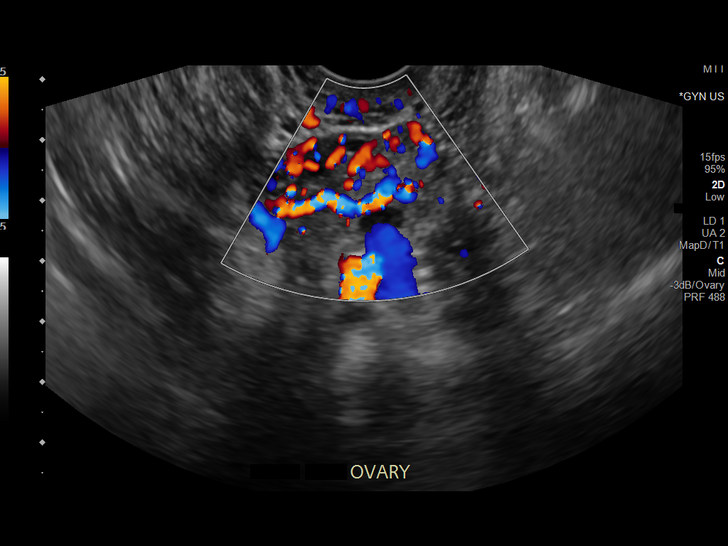
[im 81/98]
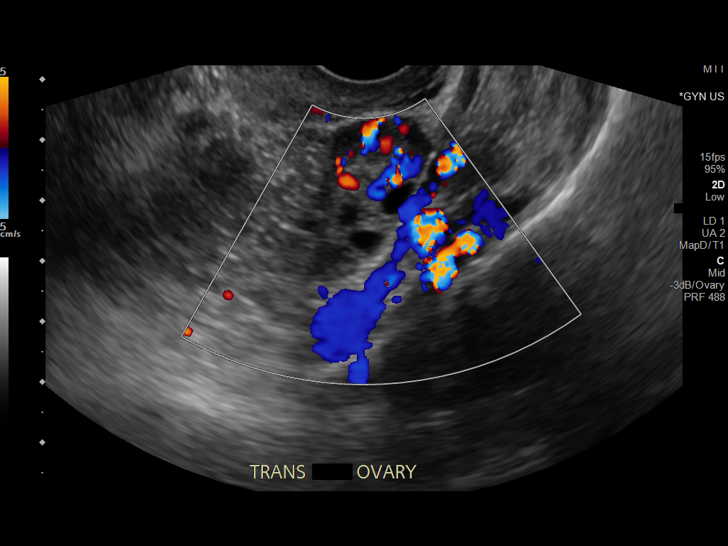
[im 89/98]
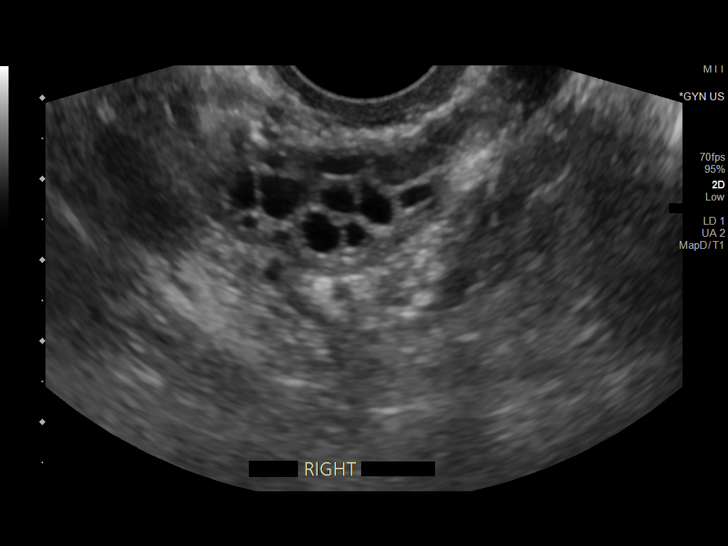
[im 98/98]
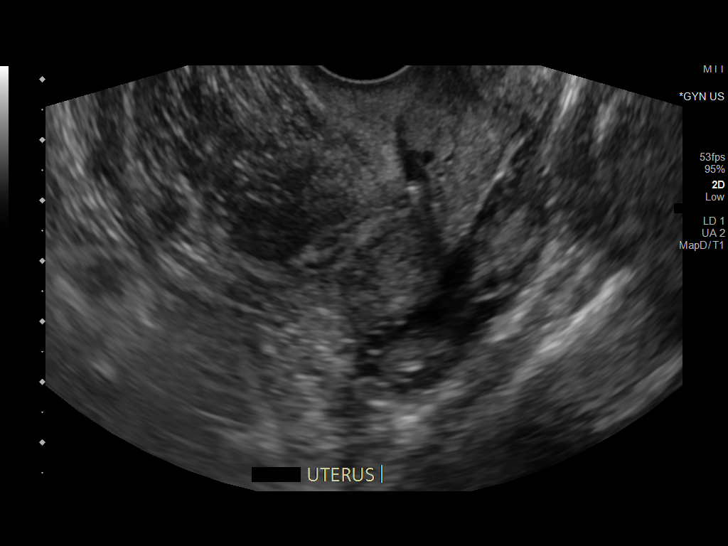

[13 of 25 positions shown; findings below may reference images not displayed]

FINDINGS: Uterus

Measurements: 6.7 x 2.8 x 3.9 cm = volume: 38 mL. No fibroids or
other mass visualized. Abnormal low positioning of the IUD in the
lower uterine segment and cervix.

Endometrium

Thickness: 4 mm.  No focal abnormality visualized.

Right ovary

Measurements: 3.9 x 1.8 x 2.0 cm = volume: 7 mL. Normal
appearance/no adnexal mass.

Left ovary

Measurements: 3.2 x 2.6 x 3.1 cm = volume: 14 mL. Normal
appearance/no adnexal mass.

Other findings

Trace free fluid in the pelvis, likely physiologic.
IMPRESSION: 1. Abnormal low positioning of the IUD in the lower uterine segment
and cervix.
2. Otherwise unremarkable pelvic ultrasound. No evidence of
tubo-ovarian abscess.

## 2019-05-27 ENCOUNTER — Other Ambulatory Visit: Payer: Self-pay

## 2019-05-27 ENCOUNTER — Encounter: Payer: Self-pay | Admitting: Neurology

## 2019-05-27 ENCOUNTER — Telehealth (INDEPENDENT_AMBULATORY_CARE_PROVIDER_SITE_OTHER): Payer: BC Managed Care – PPO | Admitting: Neurology

## 2019-05-27 VITALS — Ht 65.0 in | Wt 190.0 lb

## 2019-05-27 DIAGNOSIS — G43009 Migraine without aura, not intractable, without status migrainosus: Secondary | ICD-10-CM

## 2019-05-27 NOTE — Patient Instructions (Signed)
Continue topiramate and sumatriptan Follow up in one year

## 2019-07-25 ENCOUNTER — Telehealth (INDEPENDENT_AMBULATORY_CARE_PROVIDER_SITE_OTHER): Payer: BC Managed Care – PPO | Admitting: Family Medicine

## 2019-07-25 ENCOUNTER — Encounter: Payer: Self-pay | Admitting: Family Medicine

## 2019-07-25 VITALS — Temp 98.7°F | Wt 197.0 lb

## 2019-07-25 DIAGNOSIS — Z7189 Other specified counseling: Secondary | ICD-10-CM

## 2019-07-25 DIAGNOSIS — L0292 Furuncle, unspecified: Secondary | ICD-10-CM

## 2019-07-25 DIAGNOSIS — Z7185 Encounter for immunization safety counseling: Secondary | ICD-10-CM

## 2019-07-25 MED ORDER — FLUCONAZOLE 150 MG PO TABS: 150.0000 mg | ORAL_TABLET | Freq: Once | ORAL | 0 refills | Status: AC

## 2019-07-25 MED ORDER — DOXYCYCLINE HYCLATE 100 MG PO TABS: 100.0000 mg | ORAL_TABLET | Freq: Two times a day (BID) | ORAL | 0 refills | Status: DC

## 2019-07-25 NOTE — Progress Notes (Signed)
Virtual Visit via Video Note  I connected with Katie Alvarez  on 07/25/19 at  3:20 PM EDT by a video enabled telemedicine application and verified that I am speaking with the correct person using two identifiers.  Location patient: home, [place] Location provider:work or home office Persons participating in the virtual visit: patient, provider  I discussed the limitations of evaluation and management by telemedicine and the availability of in person appointments. The patient expressed understanding and agreed to proceed.   HPI:  Acute visit for a skin rash: -dry shaved before swimming a few days ago -has developed a painful rash under the axillary region -she tried changing her deodorant but this did not help -she has had boils under the arms and in the groin region in the past and required an oral antibiotic -denies spreading, malaise, fevers -FDLMP: has nexplanon - does not get periods on this, had neg pregnancy test yesterday, reports is sure she is not pregnant  ROS: See pertinent positives and negatives per HPI.  Past Medical History:  Diagnosis Date  . Band keratopathy of both eyes   . Hypertension 11/20/2013  . Hypophosphatemia   . Migraines    with aura  . Mononucleosis   . Mononucleosis, infectious, with hepatitis    per patient no hepatitis  . STD (sexually transmitted disease) 02/10/2017   chlamydia   . Vitamin D deficiency disease     Past Surgical History:  Procedure Laterality Date  . ANTERIOR CRUCIATE LIGAMENT REPAIR Bilateral    2013 & 2015  . INTRAUTERINE DEVICE INSERTION  01/2017   Kyleena   . liletta iud     inserted 11-23-15  . OTHER SURGICAL HISTORY Right 2013   ACL reconstruction times 2  . WISDOM TOOTH EXTRACTION      Family History  Problem Relation Age of Onset  . Diabetes Mother   . Obesity Mother   . Asthma Mother   . Skin cancer Mother   . Thyroid disease Maternal Aunt   . Thyroid disease Maternal Grandmother   . Osteoporosis Maternal  Grandmother   . Skin cancer Maternal Grandmother   . Asthma Maternal Uncle   . Heart attack Father   . Bipolar disorder Brother   . Asthma Brother   . Heart attack Maternal Grandfather     SOCIAL HX: see hpi   Current Outpatient Medications:  .  acetaminophen (TYLENOL) 500 MG tablet, Take 500-1,000 mg by mouth every 6 (six) hours as needed for mild pain. , Disp: , Rfl:  .  cetirizine (ZYRTEC) 10 MG tablet, Take 10 mg by mouth 2 (two) times daily. , Disp: , Rfl:  .  Cholecalciferol (VITAMIN D PO), Take 1 capsule by mouth daily. , Disp: , Rfl:  .  citalopram (CELEXA) 40 MG tablet, Take 1 tablet (40 mg total) by mouth daily., Disp: 90 tablet, Rfl: 1 .  EPINEPHrine 0.3 mg/0.3 mL IJ SOAJ injection, Inject 0.3 mLs (0.3 mg total) into the muscle once., Disp: 1 Device, Rfl: 0 .  etonogestrel (NEXPLANON) 68 MG IMPL implant, Nexplanon 68 mg subdermal implant  Inject 1 implant by subcutaneous route., Disp: , Rfl:  .  ibuprofen (ADVIL,MOTRIN) 800 MG tablet, Take 1 tablet (800 mg total) by mouth every 8 (eight) hours as needed., Disp: 30 tablet, Rfl: 1 .  SUMAtriptan (IMITREX) 100 MG tablet, TAKE 1 TAB AT ONSET OF MIGRAINE. MAY REPEAT IN 2 HRS IF HEADACHE PERSISTS OR RECURS. MAX 2 TAB/24, Disp: 10 tablet, Rfl: 2 .  topiramate (  TOPAMAX) 100 MG tablet, TAKE 1 TABLET BY MOUTH EVERY DAY, Disp: 90 tablet, Rfl: 1 .  triamcinolone cream (KENALOG) 0.1 %, Apply 1 application topically 2 (two) times daily., Disp: 45 g, Rfl: 2 .  valACYclovir (VALTREX) 1000 MG tablet, TAKE 1 TABLET BY MOUTH EVERY 12 HOURS FOR 10 DAYS, Disp: , Rfl:  .  clonazePAM (KLONOPIN) 0.5 MG tablet, Take 1 tablet (0.5 mg total) by mouth 2 (two) times daily., Disp: 60 tablet, Rfl: 2 .  doxycycline (VIBRA-TABS) 100 MG tablet, Take 1 tablet (100 mg total) by mouth 2 (two) times daily., Disp: 20 tablet, Rfl: 0 .  fluconazole (DIFLUCAN) 150 MG tablet, Take 1 tablet (150 mg total) by mouth once for 1 dose., Disp: 1 tablet, Rfl: 0  EXAM:  VITALS  per patient if applicable:  GENERAL: alert, oriented, appears well and in no acute distress  HEENT: atraumatic, conjunttiva clear, no obvious abnormalities on inspection of external nose and ears  NECK: normal movements of the head and neck  LUNGS: on inspection no signs of respiratory distress, breathing rate appears normal, no obvious gross SOB, gasping or wheezing  CV: no obvious cyanosis  MS: moves all visible extremities without noticeable abnormality  SKIN: erythematous nodule L axillary region approx nickel sized - she reports is ttp  PSYCH/NEURO: pleasant and cooperative, no obvious depression or anxiety, speech and thought processing grossly intact  ASSESSMENT AND PLAN:  Discussed the following assessment and plan:  Boil  Vaccine counseling  -we discussed possible serious and likely etiologies, options for evaluation and workup, limitations of telemedicine visit vs in person visit, treatment, treatment risks and precautions. Pt prefers to treat via telemedicine empirically rather then risking or undertaking an in person visit at this moment. Opted to treat acute boil with abx (doxy 100mg  bid x 10 days) - duration extended given hx of recurrent boils, possible underlying hydradenitis. Advise to follow up if any further issues and may require longer term topical or oral treatment. She had questions regarding the COVID19 vaccine. Discussed and answered. Patient agrees to seek prompt in person care if worsening, new symptoms arise, or if is not improving with treatment.   I discussed the assessment and treatment plan with the patient. The patient was provided an opportunity to ask questions and all were answered. The patient agreed with the plan and demonstrated an understanding of the instructions.   The patient was advised to call back or seek an in-person evaluation if the symptoms worsen or if the condition fails to improve as anticipated.   , DO

## 2019-07-29 ENCOUNTER — Other Ambulatory Visit: Payer: Self-pay | Admitting: Adult Health

## 2019-07-29 DIAGNOSIS — F419 Anxiety disorder, unspecified: Secondary | ICD-10-CM

## 2019-07-30 ENCOUNTER — Other Ambulatory Visit: Payer: Self-pay | Admitting: Adult Health

## 2019-07-30 DIAGNOSIS — F419 Anxiety disorder, unspecified: Secondary | ICD-10-CM

## 2019-07-30 MED ORDER — CITALOPRAM HYDROBROMIDE 40 MG PO TABS: 40.0000 mg | ORAL_TABLET | Freq: Every day | ORAL | 1 refills | Status: DC

## 2019-09-18 ENCOUNTER — Other Ambulatory Visit: Payer: Self-pay | Admitting: Adult Health

## 2019-09-19 ENCOUNTER — Other Ambulatory Visit: Payer: Self-pay | Admitting: Neurology

## 2019-11-02 ENCOUNTER — Other Ambulatory Visit: Payer: Self-pay | Admitting: Neurology

## 2019-12-11 ENCOUNTER — Other Ambulatory Visit: Payer: Self-pay | Admitting: Adult Health

## 2020-01-17 ENCOUNTER — Telehealth: Payer: Self-pay

## 2020-01-17 MED ORDER — SUMATRIPTAN SUCCINATE 100 MG PO TABS: ORAL_TABLET | ORAL | 5 refills | Status: DC

## 2020-01-17 NOTE — Telephone Encounter (Signed)
Per pt request refilled Sumatriptan until her next visit 05/2020

## 2020-01-19 ENCOUNTER — Other Ambulatory Visit: Payer: Self-pay | Admitting: Adult Health

## 2020-01-19 DIAGNOSIS — F419 Anxiety disorder, unspecified: Secondary | ICD-10-CM

## 2020-01-20 ENCOUNTER — Telehealth: Payer: Self-pay | Admitting: Neurology

## 2020-01-20 DIAGNOSIS — G43009 Migraine without aura, not intractable, without status migrainosus: Secondary | ICD-10-CM

## 2020-01-20 NOTE — Telephone Encounter (Signed)
And we can refer her to the neurologist

## 2020-01-20 NOTE — Telephone Encounter (Signed)
Pt is sch for 01-28-20 as a virtual visit

## 2020-01-20 NOTE — Telephone Encounter (Signed)
Referral added

## 2020-01-20 NOTE — Telephone Encounter (Signed)
Patient called in stating she is having more migraines daily. It is interfering with her life. She feels she may need to change medications. She also has recently moved and would like a referral to Dr. Carles Collet out of [place], [place]. He is a Insurance account manager in [employer] area.

## 2020-01-20 NOTE — Telephone Encounter (Signed)
I would recommend making a virtual video visit to discuss

## 2020-01-27 NOTE — Progress Notes (Signed)
Due to the COVID-19 crisis, this virtual check-in visit was done via telephone from my office and it was initiated and consent given by this patient and or family.  Due to my inability to log onto the virtual visit, the office follow up was switched to a telephone visit.  Telephone (Audio) Visit The purpose of this telephone visit is to provide medical care while limiting exposure to the novel coronavirus.    Consent was obtained for telephone visit and initiated by pt/family:  Yes.   Answered questions that patient had about telehealth interaction:  Yes.   I discussed the limitations, risks, security and privacy concerns of performing an evaluation and management service by telephone. I also discussed with the patient that there may be a patient responsible charge related to this service. The patient expressed understanding and agreed to proceed.  Pt location: Home Physician Location: office Name of referring provider:  Shirline Frees, NP I connected with .Katie Alvarez at patients initiation/request on 01/28/2020 at 12:50 PM EST by telephone and verified that I am speaking with the correct person using two identifiers.  Pt MRN:  [id number] Pt DOB:  [birth year]  Assessment/Plan:   Migraine without aura, without status migrainosus, intractable.  Unclear trigger.  No longer responding to topiramate or sumatriptan.  1.  For migraine prevention:  Start Aimovig 140mg  every 28 days.  Continue topiramate for now. 2.  For migraine rescue:  Maxalt MLT 10mg  with Zofran ODT 4mg .  Stop sumatriptan. 3.  Limit use of pain relievers to no more than 2 days out of week to prevent risk of rebound or medication-overuse headache. 4.  Keep headache diary 5.  If not yet established care with new neurologist, she will follow up with me in April  Need for in person visit now:  No.  Subjective:  Katie Alvarez is a 24 year oldright-handedfemale whofollows up for migraines.  UPDATE: She is now in  [place] and needs to establish care with a new neurologist.  I have placed a referral. She was doing well for a while but started to increase again about 2 months ago.  No preceding event (head trauma, new stressors, new medications). Intensity:Moderate to severe Duration:24 hours (one lasted 3 days).  No longer responding to sumatriptan. Frequency: 8 migraines in past 30 days.  She has moved to [place] and plans to establish with a new neurologist soon.  Frequency of abortive medication:once a month Current NSAIDS:ibuprofen Current analgesics:Tylenol Current triptans:sumatriptan 100mg  Current ergotamine:none Current anti-emetic:none Current muscle relaxants:none Current anti-anxiolytic:none Current sleep aide:none Current Antihypertensive medications:none Current Antidepressant medications:Citalopram 10mg  Current Anticonvulsant medications:topiramate 100mg daily (150mg  caused increased side effects) Current anti-CGRP:none Current Vitamins/Herbal/Supplements:D Current Antihistamines/Decongestants:none Other therapy:none IUD: Kyleena  Caffeine:Decreased Dr. Karie Fetch intake; 1 to 2 ice coffees a week Smoker:1 cigarette a week. Diet:increased water intake, does not skip meals Exercise:yes. Plays volleyball Depression:Yes;Anxiety: yes Other pain:broke foot. Pain controlled. Sleep hygiene:good.  HISTORY: Onset: Childhood Location:Usually behind both eyes (predominantly left) and radiates down to the jaw or back of head Quality:Pressure, sharp InitialIntensity:7/10.Shedenies new headache, thunderclap headache or severe headache that wakes herfrom sleep. Aura:no Prodrome:no Postdrome:Feels off for 1 to 2 days (fatigue) Associated symptoms:Nausea, vomiting, photophobia, phonophobia, brief specks in vision when she blinks.Shedenies associated unilateral numbness or weakness. InitialDuration:1 to 2  days InitialFrequency:Some sort of headache 5-6 days a month. Mild headache once every 2 weeks, moderate one once a month, severe one every 3 to 6 months (requiring ED visit) Frequency of abortive medication:takes Tylenol or  ibuprofen daily Triggers: Hunger/skipped meals, strong scents, MSG Relieving factors: Sleep, cold shower Activity:Can't function  Past NSAIDS:none Past analgesics:none Past abortive triptans:none Past abortive ergotamine:none Past muscle relaxants:none Past anti-emetic:none Past antihypertensive medications:none Past antidepressant medications:none Past anticonvulsant medications:none Past anti-CGRP:none Past vitamins/Herbal/Supplements:riboflavin Past antihistamines/decongestants:none Other past therapies:none  Family history of headache:Mom has migraines    Objective:   Vitals:   01/28/20 0842  Weight: 200 lb (90.7 kg)  Height: 5\' 5"  (1.651 m)     Follow Up Instructions:      -I discussed the assessment and treatment plan with the patient. The patient was provided an opportunity to ask questions and all were answered. The patient agreed with the plan and demonstrated an understanding of the instructions.   The patient was advised to call back or seek an in-person evaluation if the symptoms worsen or if the condition fails to improve as anticipated.    Total Time spent in visit with the patient was:  9 minutes.  , DO

## 2020-01-28 ENCOUNTER — Telehealth (INDEPENDENT_AMBULATORY_CARE_PROVIDER_SITE_OTHER): Payer: BC Managed Care – PPO | Admitting: Neurology

## 2020-01-28 ENCOUNTER — Other Ambulatory Visit: Payer: Self-pay

## 2020-01-28 ENCOUNTER — Encounter: Payer: Self-pay | Admitting: Neurology

## 2020-01-28 VITALS — Ht 65.0 in | Wt 200.0 lb

## 2020-01-28 DIAGNOSIS — G43019 Migraine without aura, intractable, without status migrainosus: Secondary | ICD-10-CM | POA: Diagnosis not present

## 2020-01-28 MED ORDER — RIZATRIPTAN BENZOATE 10 MG PO TBDP
10.0000 mg | ORAL_TABLET | ORAL | 5 refills | Status: DC | PRN
Start: 2020-01-28 — End: 2020-05-26

## 2020-01-28 MED ORDER — AIMOVIG 140 MG/ML ~~LOC~~ SOAJ: 140.0000 mg | SUBCUTANEOUS | 5 refills | Status: DC

## 2020-01-28 MED ORDER — ONDANSETRON 4 MG PO TBDP
4.0000 mg | ORAL_TABLET | Freq: Three times a day (TID) | ORAL | 5 refills | Status: DC | PRN
Start: 2020-01-28 — End: 2022-04-20

## 2020-01-28 NOTE — Patient Instructions (Signed)
1.  Continue topiramate for now. 2.  We will start Aimovig 140mg  injection every 28 days.  Will need prior approval from your insurance.  Please go to [email or web address] to get a copay card that you can activate and use to bring down the cost, particularly if the insurance denies it.  Give it a 6 month trial. 3.  Stop sumatriptan.  When you get a migraine, take rizatriptan 10mg  at earliest onset of headache.  May repeat in 2 hours if needed (maximum 2 tablets in 24 hours).  Also take ondansetron (Zofran) for nausea.  Both tablets are dissolvable (put under your tongue) so they would more likely bypass your GI system given your nausea 4.  Limit use of pain relievers to no more than 2 days out of week to prevent risk of rebound or medication-overuse headache. 5.  Keep headache diary 6.  Follow up in April if you have not yet establish care with your new neurologist.

## 2020-01-30 ENCOUNTER — Other Ambulatory Visit: Payer: Self-pay | Admitting: Neurology

## 2020-01-30 ENCOUNTER — Encounter: Payer: Self-pay | Admitting: Neurology

## 2020-01-30 MED ORDER — EMGALITY 120 MG/ML ~~LOC~~ SOAJ: 120.0000 mg | SUBCUTANEOUS | 5 refills | Status: DC

## 2020-01-30 MED ORDER — EMGALITY 120 MG/ML ~~LOC~~ SOAJ: 240.0000 mg | Freq: Once | SUBCUTANEOUS | 0 refills | Status: AC

## 2020-01-30 NOTE — Progress Notes (Signed)
As Emgality is covered by her insurance and Aimovig is not, I have sent a prescription to start [employer]

## 2020-01-30 NOTE — Progress Notes (Signed)
Called [employer] to get auth for Aimovig medication - rep said that medication is not covered and that Ajovy or Emgality are the only preferred medications- I let Sheena know. She said Dr. Everlena Cooper and PT discussed using copay card if insurance denied.

## 2020-02-11 ENCOUNTER — Encounter: Payer: Self-pay | Admitting: Adult Health

## 2020-02-11 ENCOUNTER — Telehealth (INDEPENDENT_AMBULATORY_CARE_PROVIDER_SITE_OTHER): Payer: BC Managed Care – PPO | Admitting: Adult Health

## 2020-02-11 VITALS — Wt 200.0 lb

## 2020-02-11 DIAGNOSIS — F419 Anxiety disorder, unspecified: Secondary | ICD-10-CM | POA: Diagnosis not present

## 2020-02-11 NOTE — Progress Notes (Signed)
Virtual Visit via Video Note  I connected with@ on 02/11/20 at  2:00 PM EST by a video enabled telemedicine application and verified that I am speaking with the correct person using two identifiers.  Location patient: home Location provider:work or home office Persons participating in the virtual visit: patient, provider  I discussed the limitations of evaluation and management by telemedicine and the availability of in person appointments. The patient expressed understanding and agreed to proceed.   HPI: She is moving into a new apartment and has a Air cabin crew that she uses as an emotional support animal as adjunct therapy for anxiety.  She is also on Celexa.  She does report that she has good control over her anxiety symptoms with both of these modalities.  She needs a note for apartment complex stating that she does have an emotional support dog   ROS: See pertinent positives and negatives per HPI.  Past Medical History:  Diagnosis Date  . Band keratopathy of both eyes   . Hypertension 11/20/2013  . Hypophosphatemia   . Migraines    with aura  . Mononucleosis   . Mononucleosis, infectious, with hepatitis    per patient no hepatitis  . STD (sexually transmitted disease) 02/10/2017   chlamydia   . Vitamin D deficiency disease     Past Surgical History:  Procedure Laterality Date  . ANTERIOR CRUCIATE LIGAMENT REPAIR Bilateral    2013 & 2015  . INTRAUTERINE DEVICE INSERTION  01/2017   Kyleena   . liletta iud     inserted 11-23-15  . OTHER SURGICAL HISTORY Right 2013   ACL reconstruction times 2  . WISDOM TOOTH EXTRACTION      Family History  Problem Relation Age of Onset  . Diabetes Mother   . Obesity Mother   . Asthma Mother   . Skin cancer Mother   . Thyroid disease Maternal Aunt   . Thyroid disease Maternal Grandmother   . Osteoporosis Maternal Grandmother   . Skin cancer Maternal Grandmother   . Asthma Maternal Uncle   . Heart attack  Father   . Bipolar disorder Brother   . Asthma Brother   . Heart attack Maternal Grandfather        Current Outpatient Medications:  .  acetaminophen (TYLENOL) 500 MG tablet, Take 500-1,000 mg by mouth every 6 (six) hours as needed for mild pain. , Disp: , Rfl:  .  cetirizine (ZYRTEC) 10 MG tablet, Take 10 mg by mouth 2 (two) times daily. , Disp: , Rfl:  .  citalopram (CELEXA) 40 MG tablet, TAKE 1 TABLET BY MOUTH EVERY DAY, Disp: 90 tablet, Rfl: 1 .  EPINEPHrine 0.3 mg/0.3 mL IJ SOAJ injection, Inject 0.3 mLs (0.3 mg total) into the muscle once., Disp: 1 Device, Rfl: 0 .  etonogestrel (NEXPLANON) 68 MG IMPL implant, Nexplanon 68 mg subdermal implant  Inject 1 implant by subcutaneous route., Disp: , Rfl:  .  ibuprofen (ADVIL,MOTRIN) 800 MG tablet, Take 1 tablet (800 mg total) by mouth every 8 (eight) hours as needed., Disp: 30 tablet, Rfl: 1 .  ondansetron (ZOFRAN ODT) 4 MG disintegrating tablet, Take 1 tablet (4 mg total) by mouth every 8 (eight) hours as needed for nausea or vomiting., Disp: 20 tablet, Rfl: 5 .  rizatriptan (MAXALT-MLT) 10 MG disintegrating tablet, Take 1 tablet (10 mg total) by mouth as needed for migraine (May repeat in 2 hours.  Maximum 2 tablets in 24 hours.). May repeat in 2 hours if  needed, Disp: 9 tablet, Rfl: 5 .  topiramate (TOPAMAX) 100 MG tablet, TAKE 1 TABLET BY MOUTH EVERY DAY, Disp: 90 tablet, Rfl: 2 .  Galcanezumab-gnlm (EMGALITY) 120 MG/ML SOAJ, Inject 120 mg into the skin every 28 (twenty-eight) days. (Patient not taking: Reported on 02/11/2020), Disp: 1.12 mL, Rfl: 5 .  triamcinolone cream (KENALOG) 0.1 %, Apply 1 application topically 2 (two) times daily. (Patient not taking: Reported on 02/11/2020), Disp: 45 g, Rfl: 2 .  valACYclovir (VALTREX) 1000 MG tablet, TAKE 1 TABLET BY MOUTH EVERY 12 HOURS FOR 10 DAYS (Patient not taking: Reported on 02/11/2020), Disp: , Rfl:   EXAM:  VITALS per patient if applicable:  GENERAL: alert, oriented, appears well and in  no acute distress  HEENT: atraumatic, conjunttiva clear, no obvious abnormalities on inspection of external nose and ears  NECK: normal movements of the head and neck  LUNGS: on inspection no signs of respiratory distress, breathing rate appears normal, no obvious gross SOB, gasping or wheezing  CV: no obvious cyanosis  MS: moves all visible extremities without noticeable abnormality  PSYCH/NEURO: pleasant and cooperative, no obvious depression or anxiety, speech and thought processing grossly intact  ASSESSMENT AND PLAN:  Discussed the following assessment and plan:  1. Anxiety -Placed letter in my chart.  She may need to follow-up with psychiatry if her insurance complex does not accept this note    I discussed the assessment and treatment plan with the patient. The patient was provided an opportunity to ask questions and all were answered. The patient agreed with the plan and demonstrated an understanding of the instructions.   The patient was advised to call back or seek an in-person evaluation if the symptoms worsen or if the condition fails to improve as anticipated.   Shirline Frees, NP

## 2020-02-11 NOTE — Progress Notes (Signed)
   Subjective:    Patient ID: Katie Alvarez, female    DOB: [birth year], 25 y.o.   MRN: [id number]  HPI 25 year old female who  has a past medical history of Band keratopathy of both eyes, Hypertension (11/20/2013), Hypophosphatemia, Migraines, Mononucleosis, Mononucleosis, infectious, with hepatitis, STD (sexually transmitted disease) (02/10/2017), and Vitamin D deficiency disease.     Review of Systems     Objective:   Physical Exam        Assessment & Plan:

## 2020-03-08 ENCOUNTER — Other Ambulatory Visit: Payer: Self-pay | Admitting: Adult Health

## 2020-03-10 NOTE — Telephone Encounter (Signed)
Denied.  Pt is now taking a 40 mg tab.  Nothing further needed.
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
## 2020-05-26 ENCOUNTER — Telehealth (INDEPENDENT_AMBULATORY_CARE_PROVIDER_SITE_OTHER): Payer: BC Managed Care – PPO | Admitting: Neurology

## 2020-05-26 ENCOUNTER — Other Ambulatory Visit: Payer: Self-pay

## 2020-05-26 ENCOUNTER — Encounter: Payer: Self-pay | Admitting: Neurology

## 2020-05-26 VITALS — Ht 64.0 in | Wt 215.0 lb

## 2020-05-26 DIAGNOSIS — G43009 Migraine without aura, not intractable, without status migrainosus: Secondary | ICD-10-CM

## 2020-05-26 MED ORDER — UBRELVY 100 MG PO TABS: 1.0000 | ORAL_TABLET | ORAL | 5 refills | Status: DC | PRN

## 2020-10-12 ENCOUNTER — Other Ambulatory Visit: Payer: Self-pay | Admitting: Neurology

## 2020-11-11 ENCOUNTER — Telehealth: Payer: Self-pay | Admitting: Adult Health

## 2020-11-11 NOTE — Telephone Encounter (Signed)
I January COry wrote a letter for an emotional support animal.  Currently pt is moving to a new house and so the place she is moving to is wanting a letter stating patient has a disability.  Any questions you can call the patient.  Patient wants letter placed in MyChart so she can print it off.

## 2020-11-12 ENCOUNTER — Encounter: Payer: Self-pay | Admitting: Adult Health

## 2020-11-12 NOTE — Telephone Encounter (Signed)
Letter has been sent to Mychart.

## 2020-11-12 NOTE — Telephone Encounter (Signed)
Please advise 

## 2020-11-17 ENCOUNTER — Encounter: Payer: Self-pay | Admitting: Adult Health

## 2020-11-17 ENCOUNTER — Telehealth (INDEPENDENT_AMBULATORY_CARE_PROVIDER_SITE_OTHER): Payer: BC Managed Care – PPO | Admitting: Adult Health

## 2020-11-17 VITALS — HR 71 | Ht 64.0 in | Wt 215.0 lb

## 2020-11-17 DIAGNOSIS — F419 Anxiety disorder, unspecified: Secondary | ICD-10-CM | POA: Diagnosis not present

## 2020-11-17 DIAGNOSIS — F4310 Post-traumatic stress disorder, unspecified: Secondary | ICD-10-CM | POA: Diagnosis not present

## 2020-11-17 DIAGNOSIS — F32A Depression, unspecified: Secondary | ICD-10-CM
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
## 2020-11-24 NOTE — Progress Notes (Deleted)
Virtual Visit via Video Note The purpose of this virtual visit is to provide medical care while limiting exposure to the novel coronavirus.    Consent was obtained for video visit:  Yes.   Answered questions that patient had about telehealth interaction:  Yes.   I discussed the limitations, risks, security and privacy concerns of performing an evaluation and management service by telemedicine. I also discussed with the patient that there may be a patient responsible charge related to this service. The patient expressed understanding and agreed to proceed.  Pt location: Home Physician Location: office Name of referring provider:  Shirline Frees, NP I connected with Katie Alvarez at patients initiation/request on 11/25/2020 at  2:30 PM EDT by video enabled telemedicine application and verified that I am speaking with the correct person using two identifiers. Pt MRN:  [id number] Pt DOB:  07-05-1995 Video Participants:  Katie Alvarez  Assessment and Plan:   Migraine without aura, without status migrainosus, not intractable  Migraine prevention:  Trokendi XR 100mg  daily Migraine rescue:  ***; Zofran for nausea Limit use of pain relievers to no more than 2 days out of week to prevent risk of rebound or medication-overuse headache. Keep headache diary Follow up ***   History of Present Illness:  Katie Alvarez is a 25 year old right-handed female who follows up for migraine.   UPDATE30? Intensity:  Moderate to severe Duration:  24 hours with rizatriptan.  She thinks the sumatriptan was better. Frequency:  8 migraines in past 30 days.  Intensity:  Moderate to severe Duration:  All day  Frequency:  2-3 in past 30 days Frequency of abortive medication: once a month Current NSAIDS:  ibuprofen Current analgesics:  Tylenol Current triptans:  Maxalt MLT 10mg  (makes her sick) Current ergotamine:  none Current anti-emetic:  Zofran ODT 4mg  Current muscle relaxants:  none Current  anti-anxiolytic:  none Current sleep aide:  none Current Antihypertensive medications:  none Current Antidepressant medications:  Citalopram 10mg  Current Anticonvulsant medications:  Trokendi XR 100mg  QD Current anti-CGRP:  none Current Vitamins/Herbal/Supplements:  D Current Antihistamines/Decongestants:  Zyrtec Other therapy:  none Hormone/birth control:  Nexplanon   Caffeine:  Decreased Dr. Bernita Raisin intake; 1 to 2 ice coffees a week Smoker:  1 cigarette a week. Diet: increased water intake, does not skip meals Exercise:  yes.  Plays volleyball Depression:  Yes; Anxiety:  yes Other pain:  broke foot.  Pain controlled. Sleep hygiene:  good.   HISTORY:  Onset:  Childhood Location:  Usually behind both eyes (predominantly left) and radiates down to the jaw or back of head Quality:  Pressure, sharp Initial Intensity:  7/10.  She denies new headache, thunderclap headache or severe headache that wakes her from sleep. Aura:  no Prodrome:  no Postdrome:  Feels off for 1 to 2 days (fatigue) Associated symptoms:  Nausea, vomiting, photophobia, phonophobia, brief specks in vision when she blinks.  She denies associated unilateral numbness or weakness. Initial Duration:  1 to 2 days Initial Frequency:  Some sort of headache 5-6 days a month.  Mild headache once every 2 weeks, moderate one once a month, severe one every 3 to 6 months (requiring ED visit) Frequency of abortive medication: takes Tylenol or ibuprofen daily Triggers:  Hunger/skipped meals, strong scents, MSG Relieving factors: Sleep, cold shower Activity:  Can't function   Past NSAIDS:  none Past analgesics:  none Past abortive triptans:  sumatriptan 100mg  Past abortive ergotamine:  none Past muscle relaxants:  none Past anti-emetic:  none Past antihypertensive medications:  none Past antidepressant medications:  none Past anticonvulsant medications:  none Past anti-CGRP:  Aimovig 140mg  (insurance no longer covered),  Emgality (did not like using the shot) Past vitamins/Herbal/Supplements:  riboflavin Past antihistamines/decongestants:  none Other past therapies:  none   Family history of headache:  Mom has migraines  Past Medical History: Past Medical History:  Diagnosis Date   Band keratopathy of both eyes    Hypertension 11/20/2013   Hypophosphatemia    Migraines    with aura   Mononucleosis    Mononucleosis, infectious, with hepatitis    per patient no hepatitis   STD (sexually transmitted disease) 02/10/2017   chlamydia    Vitamin D deficiency disease     Medications: Outpatient Encounter Medications as of 11/25/2020  Medication Sig   acetaminophen (TYLENOL) 500 MG tablet Take 500-1,000 mg by mouth every 6 (six) hours as needed for mild pain.    cetirizine (ZYRTEC) 10 MG tablet Take 10 mg by mouth 2 (two) times daily.    citalopram (CELEXA) 40 MG tablet TAKE 1 TABLET BY MOUTH EVERY DAY   EPINEPHrine 0.3 mg/0.3 mL IJ SOAJ injection Inject 0.3 mLs (0.3 mg total) into the muscle once.   etonogestrel (NEXPLANON) 68 MG IMPL implant Nexplanon 68 mg subdermal implant  Inject 1 implant by subcutaneous route.   ibuprofen (ADVIL,MOTRIN) 800 MG tablet Take 1 tablet (800 mg total) by mouth every 8 (eight) hours as needed.   ondansetron (ZOFRAN ODT) 4 MG disintegrating tablet Take 1 tablet (4 mg total) by mouth every 8 (eight) hours as needed for nausea or vomiting.   prazosin (MINIPRESS) 1 MG capsule Take 1 mg by mouth at bedtime.   Topiramate ER (TROKENDI XR) 100 MG CP24 Take by mouth.   triamcinolone cream (KENALOG) 0.1 % Apply 1 application topically 2 (two) times daily.   UBRELVY 100 MG TABS TAKE ONE (1) TABLET BY MOUTH AS NEEDED (MAY REPEAT IN TWO (2) HOURS. MAXIMUM TWO (2) TABLETS IN 24 HOURS.).   valACYclovir (VALTREX) 1000 MG tablet    No facility-administered encounter medications on file as of 11/25/2020.    Allergies: Allergies  Allergen Reactions   Sulfa Antibiotics Hives and  Shortness Of Breath   Dust Mite Extract Hives    Family History: Family History  Problem Relation Age of Onset   Diabetes Mother    Obesity Mother    Asthma Mother    Skin cancer Mother    Thyroid disease Maternal Aunt    Thyroid disease Maternal Grandmother    Osteoporosis Maternal Grandmother    Skin cancer Maternal Grandmother    Asthma Maternal Uncle    Heart attack Father    Bipolar disorder Brother    Asthma Brother    Heart attack Maternal Grandfather     Observations/Objective:   *** No acute distress.  Alert and oriented.  Speech fluent and not dysarthric.  Language intact.  Eyes orthophoric on primary gaze.  Face symmetric.   Follow Up Instructions:    -I discussed the assessment and treatment plan with the patient. The patient was provided an opportunity to ask questions and all were answered. The patient agreed with the plan and demonstrated an understanding of the instructions.   The patient was advised to call back or seek an in-person evaluation if the symptoms worsen or if the condition fails to improve as anticipated.    Total Time spent in visit with the patient was:  ***,  of which more than 50% of the time was spent in counseling and/or coordinating care on ***.   Pt understands and agrees with the plan of care outlined.     Cira Servant, DO

## 2020-11-25 ENCOUNTER — Telehealth: Payer: BC Managed Care – PPO | Admitting: Neurology

## 2020-11-25 ENCOUNTER — Other Ambulatory Visit: Payer: Self-pay

## 2022-04-20 NOTE — Progress Notes (Signed)
Subjective:    Patient ID: Katie Alvarez, female    DOB: 1995-03-12, 27 y.o.   MRN: [id number]  HPI Patient presents for yearly preventative medicine examination. She is a pleasant 27 year old female who  has a past medical history of Band keratopathy of both eyes, Hypertension (11/20/2013), Hypophosphatemia, Migraines, Mononucleosis, Mononucleosis, infectious, with hepatitis, STD (sexually transmitted disease) (02/10/2017), and Vitamin D deficiency disease.  She has not been seen since 2022 - she moved to [place]. She is now living in [place] after she got a promotion at work. She has been in [place] for about 3 months.   Anxiety/Depression/Bipolar -reports that she was diagnosed with bipolar disorder by her psychiatrist at the [facility] ( I do not have these results) .  She was placed on Vraylar 1.5 mg and reports that she did have significant improvement in her bipolar symptoms.  She has remained on Celexa 40 mg but since moving to [place] and having to deal with family issues she has had increase in anxiety and depression.  She has not found a psychiatrist yet in [place].   Migraine Headaches - takes Topamax 100 mg ER daily. And Ubrelvy 100 mg. Migraines are well controlled.   Obesity -has been dieting and exercising but unable to lose weight.  She does report a lot of stress eating due to anxiety and depression.  She is interested in GLP 1 therapy.  All immunizations and health maintenance protocols were reviewed with the patient and needed orders were placed.  Appropriate screening laboratory values were ordered for the patient including screening of hyperlipidemia, renal function and hepatic function.  Medication reconciliation,  past medical history, social history, problem list and allergies were reviewed in detail with the patient  Goals were established with regard to weight loss, exercise, and  diet in compliance with medications Wt Readings from Last 3 Encounters:  04/21/22 245  lb (111.1 kg)  11/17/20 215 lb (97.5 kg)  05/26/20 215 lb (97.5 kg)   She does report being up to date on PAP  Review of Systems  Constitutional: Negative.   HENT: Negative.    Eyes: Negative.   Respiratory: Negative.    Cardiovascular: Negative.   Gastrointestinal: Negative.   Endocrine: Negative.   Genitourinary: Negative.   Musculoskeletal: Negative.   Skin: Negative.   Allergic/Immunologic: Negative.   Neurological: Negative.   Hematological: Negative.   Psychiatric/Behavioral:  Positive for decreased concentration. The patient is nervous/anxious.    Past Medical History:  Diagnosis Date   Band keratopathy of both eyes    Hypertension 11/20/2013   Hypophosphatemia    Migraines    with aura   Mononucleosis    Mononucleosis, infectious, with hepatitis    per patient no hepatitis   STD (sexually transmitted disease) 02/10/2017   chlamydia    Vitamin D deficiency disease     Social History   Socioeconomic History   Marital status: Single    Spouse name: Not on file   Number of children: 0   Years of education: Not on file   Highest education level: Some college, no degree  Occupational History   Occupation: Ship broker    Comment: Senior  Tobacco Use   Smoking status: Former    Types: Cigarettes    Quit date: 04/18/2018    Years since quitting: 4.0   Smokeless tobacco: Never  Vaping Use   Vaping Use: Every day  Substance and Sexual Activity   Alcohol use: Yes  Alcohol/week: 0.0 standard drinks of alcohol    Comment: wine, coctails   Drug use: No   Sexual activity: Yes    Partners: Male    Birth control/protection: I.U.D.  Other Topics Concern   Not on file  Social History Narrative   Lives with parents and brother. In 8th grade. Plays volleyball (in school and rec), in [employer] club. Wants to go to college and be a child abuse counselor.      Patient is right-handed. She lives with her parents in a one level home. She drinks 20 oz of soda a day and  occasionally tea and coffee.   Social Determinants of Health   Financial Resource Strain: Not on file  Food Insecurity: Not on file  Transportation Needs: Not on file  Physical Activity: Not on file  Stress: Not on file  Social Connections: Not on file  Intimate Partner Violence: Not on file    Past Surgical History:  Procedure Laterality Date   ANTERIOR CRUCIATE LIGAMENT REPAIR Bilateral    2013 & 2015   INTRAUTERINE DEVICE INSERTION  01/2017   Kyleena    liletta iud     inserted 11-23-15   OTHER SURGICAL HISTORY Right 2013   ACL reconstruction times 2   WISDOM TOOTH EXTRACTION      Family History  Problem Relation Age of Onset   Diabetes Mother    Obesity Mother    Asthma Mother    Skin cancer Mother    Thyroid disease Maternal Aunt    Thyroid disease Maternal Grandmother    Osteoporosis Maternal Grandmother    Skin cancer Maternal Grandmother    Asthma Maternal Uncle    Heart attack Father    Bipolar disorder Brother    Asthma Brother    Heart attack Maternal Grandfather     Allergies  Allergen Reactions   Sulfa Antibiotics Hives and Shortness Of Breath   Dust Mite Extract Hives    Current Outpatient Medications on File Prior to Visit  Medication Sig Dispense Refill   etonogestrel (NEXPLANON) 68 MG IMPL implant Nexplanon 68 mg subdermal implant  Inject 1 implant by subcutaneous route.     prazosin (MINIPRESS) 1 MG capsule Take 1 mg by mouth at bedtime.     Topiramate ER (TROKENDI XR) 100 MG CP24 Take by mouth.     UBRELVY 100 MG TABS TAKE ONE (1) TABLET BY MOUTH AS NEEDED (MAY REPEAT IN TWO (2) HOURS. MAXIMUM TWO (2) TABLETS IN 24 HOURS.). 16 tablet 0   VRAYLAR 1.5 MG capsule      No current facility-administered medications on file prior to visit.    BP 100/88   Pulse 70   Temp 98 F (36.7 C) (Oral)   Ht 5' 5.25" (1.657 m)   Wt 245 lb (111.1 kg)   SpO2 98%   BMI 40.46 kg/m       Objective:   Physical Exam Vitals and nursing note reviewed.   Constitutional:      General: She is not in acute distress.    Appearance: Normal appearance. She is obese. She is not ill-appearing.  HENT:     Head: Normocephalic and atraumatic.     Right Ear: Tympanic membrane, ear canal and external ear normal. There is no impacted cerumen.     Left Ear: Tympanic membrane, ear canal and external ear normal. There is no impacted cerumen.     Nose: Nose normal. No congestion or rhinorrhea.     Mouth/Throat:  Mouth: Mucous membranes are moist.     Pharynx: Oropharynx is clear.  Eyes:     Extraocular Movements: Extraocular movements intact.     Conjunctiva/sclera: Conjunctivae normal.     Pupils: Pupils are equal, round, and reactive to light.  Neck:     Vascular: No carotid bruit.  Cardiovascular:     Rate and Rhythm: Normal rate and regular rhythm.     Pulses: Normal pulses.     Heart sounds: No murmur heard.    No friction rub. No gallop.  Pulmonary:     Effort: Pulmonary effort is normal.     Breath sounds: Normal breath sounds.  Abdominal:     General: Abdomen is flat. Bowel sounds are normal. There is no distension.     Palpations: Abdomen is soft. There is no mass.     Tenderness: There is no abdominal tenderness. There is no guarding or rebound.     Hernia: No hernia is present.  Musculoskeletal:        General: Normal range of motion.     Cervical back: Normal range of motion and neck supple.  Lymphadenopathy:     Cervical: No cervical adenopathy.  Skin:    General: Skin is warm and dry.     Capillary Refill: Capillary refill takes less than 2 seconds.  Neurological:     General: No focal deficit present.     Mental Status: She is alert and oriented to person, place, and time.  Psychiatric:        Mood and Affect: Mood normal.        Behavior: Behavior normal.        Thought Content: Thought content normal.        Judgment: Judgment normal.        Assessment & Plan:  1. Routine general medical examination at a health  care facility  - CBC with Differential/Platelet; Future - Comprehensive metabolic panel; Future - Hemoglobin A1c; Future - Lipid panel; Future - TSH; Future  2. Anxiety and depression - GAD 7 - 17  - PHQ 9 = 13 - Will place on wellbutrin for the time being. She will need to be seen by Psychiatry in [place] - Will have her start weaning off Celexa due to diagnoses of bipolar disorder  - citalopram (CELEXA) 40 MG tablet; Take 1 tablet (40 mg total) by mouth daily.  Dispense: 90 tablet; Refill: 0 - buPROPion (WELLBUTRIN XL) 150 MG 24 hr tablet; Take 1 tablet (150 mg total) by mouth daily.  Dispense: 90 tablet; Refill: 0 - Follow up in one month   3. Class 3 obesity (HCC) - Her insurance does not cover GLP-1 for weight loss.  - Consider phentermine  - CBC with Differential/Platelet; Future - Comprehensive metabolic panel; Future - Hemoglobin A1c; Future - Lipid panel; Future - TSH; Future  4. Other migraine with status migrainosus, intractable - Continue Topmax daily and Ubrelvy as needed  Dorothyann Peng, NP

## 2022-04-21 ENCOUNTER — Other Ambulatory Visit (INDEPENDENT_AMBULATORY_CARE_PROVIDER_SITE_OTHER): Payer: BC Managed Care – PPO

## 2022-04-21 ENCOUNTER — Encounter: Payer: Self-pay | Admitting: Adult Health

## 2022-04-21 ENCOUNTER — Ambulatory Visit (INDEPENDENT_AMBULATORY_CARE_PROVIDER_SITE_OTHER): Payer: BC Managed Care – PPO | Admitting: Adult Health

## 2022-04-21 VITALS — BP 100/88 | HR 70 | Temp 98.0°F | Ht 65.25 in | Wt 245.0 lb

## 2022-04-21 DIAGNOSIS — Z0000 Encounter for general adult medical examination without abnormal findings: Secondary | ICD-10-CM

## 2022-04-21 DIAGNOSIS — F419 Anxiety disorder, unspecified: Secondary | ICD-10-CM

## 2022-04-21 DIAGNOSIS — G43811 Other migraine, intractable, with status migrainosus: Secondary | ICD-10-CM

## 2022-04-21 DIAGNOSIS — Z0001 Encounter for general adult medical examination with abnormal findings: Secondary | ICD-10-CM | POA: Diagnosis not present

## 2022-04-21 DIAGNOSIS — F32A Depression, unspecified: Secondary | ICD-10-CM

## 2022-04-21 LAB — COMPREHENSIVE METABOLIC PANEL
ALT: 15 U/L (ref 0–35)
AST: 12 U/L (ref 0–37)
Albumin: 4.4 g/dL (ref 3.5–5.2)
Alkaline Phosphatase: 92 U/L (ref 39–117)
BUN: 11 mg/dL (ref 6–23)
CO2: 23 mEq/L (ref 19–32)
Calcium: 9.6 mg/dL (ref 8.4–10.5)
Chloride: 106 mEq/L (ref 96–112)
Creatinine, Ser: 1 mg/dL (ref 0.40–1.20)
GFR: 78.55 mL/min (ref >60.00)
Glucose, Bld: 101 mg/dL — ABNORMAL HIGH (ref 70–99)
Potassium: 4.4 mEq/L (ref 3.5–5.1)
Sodium: 137 mEq/L (ref 135–145)
Total Bilirubin: 0.4 mg/dL (ref 0.2–1.2)
Total Protein: 7.7 g/dL (ref 6.0–8.3)

## 2022-04-21 LAB — CBC WITH DIFFERENTIAL/PLATELET
Basophils Absolute: 0 10*3/uL (ref 0.0–0.1)
Basophils Relative: 0.3 % (ref 0.0–3.0)
Eosinophils Absolute: 0.1 10*3/uL (ref 0.0–0.7)
Eosinophils Relative: 1.2 % (ref 0.0–5.0)
HCT: 43.2 % (ref 36.0–46.0)
Hemoglobin: 14.8 g/dL (ref 12.0–15.0)
Lymphocytes Relative: 30.1 % (ref 12.0–46.0)
Lymphs Abs: 2.5 10*3/uL (ref 0.7–4.0)
MCHC: 34.3 g/dL (ref 30.0–36.0)
MCV: 86.9 fl (ref 78.0–100.0)
Monocytes Absolute: 0.6 10*3/uL (ref 0.1–1.0)
Monocytes Relative: 7.5 % (ref 3.0–12.0)
Neutro Abs: 5.1 10*3/uL (ref 1.4–7.7)
Neutrophils Relative %: 60.9 % (ref 43.0–77.0)
Platelets: 417 10*3/uL — ABNORMAL HIGH (ref 150.0–400.0)
RBC: 4.97 Mil/uL (ref 3.87–5.11)
RDW: 12.9 % (ref 11.5–15.5)
WBC: 8.4 10*3/uL (ref 4.0–10.5)

## 2022-04-21 LAB — LDL CHOLESTEROL, DIRECT: Direct LDL: 132 mg/dL

## 2022-04-21 LAB — LIPID PANEL
Cholesterol: 189 mg/dL (ref 0–200)
HDL: 38 mg/dL — ABNORMAL LOW (ref >39.00)
NonHDL: 151.44
Total CHOL/HDL Ratio: 5
Triglycerides: 224 mg/dL — ABNORMAL HIGH (ref 0.0–149.0)
VLDL: 44.8 mg/dL — ABNORMAL HIGH (ref 0.0–40.0)

## 2022-04-21 LAB — HEMOGLOBIN A1C: Hgb A1c MFr Bld: 5.4 % (ref 4.6–6.5)

## 2022-04-21 LAB — TSH: TSH: 3.18 u[IU]/mL (ref 0.35–5.50)

## 2022-04-21 MED ORDER — BUPROPION HCL ER (XL) 150 MG PO TB24: 150.0000 mg | ORAL_TABLET | Freq: Every day | ORAL | 0 refills | Status: DC

## 2022-04-21 MED ORDER — CITALOPRAM HYDROBROMIDE 40 MG PO TABS: 40.0000 mg | ORAL_TABLET | Freq: Every day | ORAL | 0 refills | Status: DC

## 2022-04-21 NOTE — Patient Instructions (Signed)
It was great seeing you today   We will follow up with you regarding your lab work   Please let me know if you need anything   Please go to the [facility] office at [address] in [place] for labs - Go to the basement - look for lab.

## 2022-04-22 ENCOUNTER — Telehealth: Payer: Self-pay | Admitting: Adult Health

## 2022-04-22 MED ORDER — PHENTERMINE HCL 15 MG PO CAPS: 15.0000 mg | ORAL_CAPSULE | ORAL | 0 refills | Status: DC

## 2022-04-22 NOTE — Telephone Encounter (Signed)
Updated patient on labs. We will start her on phentermine 15 mg daily for weight loss.   Follow up in one month virtually

## 2022-04-26 ENCOUNTER — Other Ambulatory Visit: Payer: Self-pay | Admitting: Adult Health

## 2022-04-26 MED ORDER — PHENTERMINE HCL 15 MG PO CAPS: 15.0000 mg | ORAL_CAPSULE | ORAL | 0 refills | Status: DC

## 2022-05-24 ENCOUNTER — Telehealth (INDEPENDENT_AMBULATORY_CARE_PROVIDER_SITE_OTHER): Payer: BC Managed Care – PPO | Admitting: Adult Health

## 2022-05-24 ENCOUNTER — Encounter: Payer: Self-pay | Admitting: Adult Health

## 2022-05-24 ENCOUNTER — Telehealth: Payer: BC Managed Care – PPO | Admitting: Adult Health

## 2022-05-24 VITALS — Ht 65.25 in | Wt 240.0 lb

## 2022-05-24 MED ORDER — PHENTERMINE HCL 30 MG PO CAPS
30.0000 mg | ORAL_CAPSULE | ORAL | 0 refills | Status: AC
Start: 2022-05-24 — End: ?

## 2022-05-24 NOTE — Progress Notes (Signed)
   Subjective:    Patient ID: Katie Alvarez, female    DOB: [birth year], 27 y.o.   MRN: [id number]  HPI    Review of Systems     Objective:   Physical Exam        Assessment & Plan:

## 2022-05-24 NOTE — Progress Notes (Signed)
Virtual Visit via Video Note  I connected with Karie Fetch  on 05/24/22 at  5:00 PM EDT by a video enabled telemedicine application and verified that I am speaking with the correct person using two identifiers.  Location patient: home Location provider:work or home office Persons participating in the virtual visit: patient, provider  I discussed the limitations of evaluation and management by telemedicine and the availability of in person appointments. The patient expressed understanding and agreed to proceed.   HPI: 27 year old female who is being evaluated today for 1 month follow-up regarding weight loss management.  1 month ago she was placed on phentermine 15 mg daily.  She does report that she has been tolerating this medication well with no side effects such as anorexia, insomnia, or palpitations.  Phentermine seems to be curbing her appetite.  She has been traveling a lot for work.  She has been able to lose approximately 5 pounds.  Her insurance does not cover GLP-1 RA's.   ROS: See pertinent positives and negatives per HPI.  Past Medical History:  Diagnosis Date   Band keratopathy of both eyes    Hypertension 11/20/2013   Hypophosphatemia    Migraines    with aura   Mononucleosis    Mononucleosis, infectious, with hepatitis    per patient no hepatitis   STD (sexually transmitted disease) 02/10/2017   chlamydia    Vitamin D deficiency disease     Past Surgical History:  Procedure Laterality Date   ANTERIOR CRUCIATE LIGAMENT REPAIR Bilateral    2013 & 2015   INTRAUTERINE DEVICE INSERTION  01/2017   Kyleena    liletta iud     inserted 11-23-15   OTHER SURGICAL HISTORY Right 2013   ACL reconstruction times 2   WISDOM TOOTH EXTRACTION      Family History  Problem Relation Age of Onset   Diabetes Mother    Obesity Mother    Asthma Mother    Skin cancer Mother    Thyroid disease Maternal Aunt    Thyroid disease Maternal Grandmother    Osteoporosis Maternal  Grandmother    Skin cancer Maternal Grandmother    Asthma Maternal Uncle    Heart attack Father    Bipolar disorder Brother    Asthma Brother    Heart attack Maternal Grandfather        Current Outpatient Medications:    buPROPion (WELLBUTRIN XL) 150 MG 24 hr tablet, Take 1 tablet (150 mg total) by mouth daily., Disp: 90 tablet, Rfl: 0   citalopram (CELEXA) 40 MG tablet, Take 1 tablet (40 mg total) by mouth daily., Disp: 90 tablet, Rfl: 0   etonogestrel (NEXPLANON) 68 MG IMPL implant, Nexplanon 68 mg subdermal implant  Inject 1 implant by subcutaneous route., Disp: , Rfl:    phentermine 30 MG capsule, Take 1 capsule (30 mg total) by mouth every morning., Disp: 30 capsule, Rfl: 0   prazosin (MINIPRESS) 1 MG capsule, Take 1 mg by mouth at bedtime., Disp: , Rfl:    Topiramate ER (TROKENDI XR) 100 MG CP24, Take by mouth., Disp: , Rfl:    UBRELVY 100 MG TABS, TAKE ONE (1) TABLET BY MOUTH AS NEEDED (MAY REPEAT IN TWO (2) HOURS. MAXIMUM TWO (2) TABLETS IN 24 HOURS.)., Disp: 16 tablet, Rfl: 0   VRAYLAR 1.5 MG capsule, , Disp: , Rfl:   EXAM:  VITALS per patient if applicable:  GENERAL: alert, oriented, appears well and in no acute distress  HEENT: atraumatic, conjunttiva clear, no  obvious abnormalities on inspection of external nose and ears  NECK: normal movements of the head and neck  LUNGS: on inspection no signs of respiratory distress, breathing rate appears normal, no obvious gross SOB, gasping or wheezing  CV: no obvious cyanosis  MS: moves all visible extremities without noticeable abnormality  PSYCH/NEURO: pleasant and cooperative, no obvious depression or anxiety, speech and thought processing grossly intact  ASSESSMENT AND PLAN:  Discussed the following assessment and plan:  Class 3 obesity - Plan: phentermine 30 MG capsule - will increase Phentermine to 30 mg daily.      I discussed the assessment and treatment plan with the patient. The patient was provided an  opportunity to ask questions and all were answered. The patient agreed with the plan and demonstrated an understanding of the instructions.   The patient was advised to call back or seek an in-person evaluation if the symptoms worsen or if the condition fails to improve as anticipated.   Shirline Frees, NP

## 2022-05-24 NOTE — Progress Notes (Deleted)
Virtual Visit via Video Note  I connected with Katie Alvarez  on 05/24/22 at  3:00 PM EDT by a video enabled telemedicine application and verified that I am speaking with the correct person using two identifiers.  Location patient: home Location provider:work or home office Persons participating in the virtual visit: patient, provider  I discussed the limitations of evaluation and management by telemedicine and the availability of in person appointments. The patient expressed understanding and agreed to proceed.   HPI: 27 year old female who  has a past medical history of Band keratopathy of both eyes, Hypertension (11/20/2013), Hypophosphatemia, Migraines, Mononucleosis, Mononucleosis, infectious, with hepatitis, STD (sexually transmitted disease) (02/10/2017), and Vitamin D deficiency disease.  1 month follow up regarding weight loss management. She is currently on Phentermine 15 mg daily. She has been tolerating this medication well without anorexia, insomnia, SOB, or palpitations.    ROS: See pertinent positives and negatives per HPI.  Past Medical History:  Diagnosis Date   Band keratopathy of both eyes    Hypertension 11/20/2013   Hypophosphatemia    Migraines    with aura   Mononucleosis    Mononucleosis, infectious, with hepatitis    per patient no hepatitis   STD (sexually transmitted disease) 02/10/2017   chlamydia    Vitamin D deficiency disease     Past Surgical History:  Procedure Laterality Date   ANTERIOR CRUCIATE LIGAMENT REPAIR Bilateral    2013 & 2015   INTRAUTERINE DEVICE INSERTION  01/2017   Kyleena    liletta iud     inserted 11-23-15   OTHER SURGICAL HISTORY Right 2013   ACL reconstruction times 2   WISDOM TOOTH EXTRACTION      Family History  Problem Relation Age of Onset   Diabetes Mother    Obesity Mother    Asthma Mother    Skin cancer Mother    Thyroid disease Maternal Aunt    Thyroid disease Maternal Grandmother    Osteoporosis Maternal  Grandmother    Skin cancer Maternal Grandmother    Asthma Maternal Uncle    Heart attack Father    Bipolar disorder Brother    Asthma Brother    Heart attack Maternal Grandfather        Current Outpatient Medications:    buPROPion (WELLBUTRIN XL) 150 MG 24 hr tablet, Take 1 tablet (150 mg total) by mouth daily., Disp: 90 tablet, Rfl: 0   citalopram (CELEXA) 40 MG tablet, Take 1 tablet (40 mg total) by mouth daily., Disp: 90 tablet, Rfl: 0   etonogestrel (NEXPLANON) 68 MG IMPL implant, Nexplanon 68 mg subdermal implant  Inject 1 implant by subcutaneous route., Disp: , Rfl:    phentermine 15 MG capsule, Take 1 capsule (15 mg total) by mouth every morning., Disp: 30 capsule, Rfl: 0   prazosin (MINIPRESS) 1 MG capsule, Take 1 mg by mouth at bedtime., Disp: , Rfl:    Topiramate ER (TROKENDI XR) 100 MG CP24, Take by mouth., Disp: , Rfl:    UBRELVY 100 MG TABS, TAKE ONE (1) TABLET BY MOUTH AS NEEDED (MAY REPEAT IN TWO (2) HOURS. MAXIMUM TWO (2) TABLETS IN 24 HOURS.)., Disp: 16 tablet, Rfl: 0   VRAYLAR 1.5 MG capsule, , Disp: , Rfl:   EXAM:  VITALS per patient if applicable:  GENERAL: alert, oriented, appears well and in no acute distress  HEENT: atraumatic, conjunttiva clear, no obvious abnormalities on inspection of external nose and ears  NECK: normal movements of the head and  neck  LUNGS: on inspection no signs of respiratory distress, breathing rate appears normal, no obvious gross SOB, gasping or wheezing  CV: no obvious cyanosis  MS: moves all visible extremities without noticeable abnormality  PSYCH/NEURO: pleasant and cooperative, no obvious depression or anxiety, speech and thought processing grossly intact  ASSESSMENT AND PLAN:  Discussed the following assessment and plan:  No diagnosis found.     I discussed the assessment and treatment plan with the patient. The patient was provided an opportunity to ask questions and all were answered. The patient agreed with the  plan and demonstrated an understanding of the instructions.   The patient was advised to call back or seek an in-person evaluation if the symptoms worsen or if the condition fails to improve as anticipated.   Shirline Frees, NP

## 2022-07-17 ENCOUNTER — Other Ambulatory Visit: Payer: Self-pay | Admitting: Adult Health

## 2022-07-17 DIAGNOSIS — F419 Anxiety disorder, unspecified: Secondary | ICD-10-CM

## 2022-10-20 ENCOUNTER — Other Ambulatory Visit: Payer: Self-pay | Admitting: Adult Health

## 2022-10-20 DIAGNOSIS — F419 Anxiety disorder, unspecified: Secondary | ICD-10-CM

## 2022-11-03 ENCOUNTER — Other Ambulatory Visit (HOSPITAL_COMMUNITY)
Admission: RE | Admit: 2022-11-03 | Discharge: 2022-11-03 | Disposition: A | Discharge status: Home / Self Care | Payer: BC Managed Care – PPO | Source: Ambulatory Visit | Attending: Adult Health | Admitting: Adult Health

## 2022-11-03 ENCOUNTER — Ambulatory Visit: Payer: BC Managed Care – PPO | Admitting: Adult Health

## 2022-11-03 ENCOUNTER — Encounter: Payer: Self-pay | Admitting: Adult Health

## 2022-11-03 VITALS — BP 120/60 | HR 80 | Temp 98.3°F | Ht 65.25 in | Wt 245.0 lb

## 2022-11-03 DIAGNOSIS — Z113 Encounter for screening for infections with a predominantly sexual mode of transmission: Secondary | ICD-10-CM

## 2022-11-03 DIAGNOSIS — L02412 Cutaneous abscess of left axilla: Secondary | ICD-10-CM | POA: Diagnosis not present

## 2022-11-03 DIAGNOSIS — F40243 Fear of flying: Secondary | ICD-10-CM | POA: Diagnosis not present

## 2022-11-03 DIAGNOSIS — G43811 Other migraine, intractable, with status migrainosus: Secondary | ICD-10-CM | POA: Diagnosis not present

## 2022-11-03 MED ORDER — UBRELVY 100 MG PO TABS: ORAL_TABLET | ORAL | 3 refills | Status: DC

## 2022-11-03 MED ORDER — DOXYCYCLINE HYCLATE 100 MG PO CAPS
100.0000 mg | ORAL_CAPSULE | Freq: Two times a day (BID) | ORAL | 0 refills | Status: DC
Start: 2022-11-03 — End: 2023-03-10

## 2022-11-03 MED ORDER — ALPRAZOLAM 0.25 MG PO TABS
0.2500 mg | ORAL_TABLET | Freq: Two times a day (BID) | ORAL | 0 refills | Status: DC | PRN
Start: 2022-11-03 — End: 2023-03-10

## 2022-11-03 MED ORDER — FLUCONAZOLE 150 MG PO TABS
ORAL_TABLET | ORAL | 0 refills | Status: AC
Start: 2022-11-03 — End: ?

## 2022-11-03 NOTE — Patient Instructions (Signed)
Ask your insurance if they cover Wegovy or Zepbound

## 2022-11-03 NOTE — Progress Notes (Signed)
Subjective:    Patient ID: Katie Alvarez, female    DOB: 1995-04-20, 27 y.o.   MRN: [id number]  HPI  She presents to the office today for multiple issues.  Migraine headaches -she has a long history of migraine headaches.  She reports getting 1 migraine every 2 weeks or so.  In the past she was seen by neurology and was prescribed Ubrelvy as needed.  She has not been seen by neurology since 2022.  Her psychiatrist took her off Topamax and she is wondering if I can prescribe her Bernita Raisin since this worked well for her.  She has not responded to Imitrex or Maxalt in the past. STD screening-he would like STD screening.  She denies symptoms just wants to make sure that she does not have any STDs. Skin Infection -she believes that she has an infection in her left armpit from shaving.  She has multiple small bumps.  No active drainage noted.  Does report pain Anxiety with Flying -she has a trip coming up to [place] and has anxiety with flying.  She is wondering if she can have a few Xanax to get her through the flights   Review of Systems See HPI   Past Medical History:  Diagnosis Date   Band keratopathy of both eyes    Hypertension 11/20/2013   Hypophosphatemia    Migraines    with aura   Mononucleosis    Mononucleosis, infectious, with hepatitis    per patient no hepatitis   STD (sexually transmitted disease) 02/10/2017   chlamydia    Vitamin D deficiency disease     Social History   Socioeconomic History   Marital status: Single    Spouse name: Not on file   Number of children: 0   Years of education: Not on file   Highest education level: Some college, no degree  Occupational History   Occupation: Consulting civil engineer    Comment: Senior  Tobacco Use   Smoking status: Former    Current packs/day: 0.00    Types: Cigarettes    Quit date: 04/18/2018    Years since quitting: 4.5   Smokeless tobacco: Never  Vaping Use   Vaping status: Every Day  Substance and Sexual Activity    Alcohol use: Yes    Alcohol/week: 0.0 standard drinks of alcohol    Comment: wine, coctails   Drug use: No   Sexual activity: Yes    Partners: Male    Birth control/protection: I.U.D.  Other Topics Concern   Not on file  Social History Narrative   Lives with parents and brother. In 8th grade. Plays volleyball (in school and rec), in spanish club. Wants to go to college and be a child abuse counselor.      Patient is right-handed. She lives with her parents in a one level home. She drinks 20 oz of soda a day and occasionally tea and coffee.   Social Determinants of Health   Financial Resource Strain: Not on file  Food Insecurity: Not on file  Transportation Needs: Not on file  Physical Activity: Not on file  Stress: Not on file  Social Connections: Not on file  Intimate Partner Violence: Not on file    Past Surgical History:  Procedure Laterality Date   ANTERIOR CRUCIATE LIGAMENT REPAIR Bilateral    2013 & 2015   INTRAUTERINE DEVICE INSERTION  01/2017   Kyleena    liletta iud     inserted 11-23-15   OTHER SURGICAL HISTORY Right 2013  ACL reconstruction times 2   WISDOM TOOTH EXTRACTION      Family History  Problem Relation Age of Onset   Diabetes Mother    Obesity Mother    Asthma Mother    Skin cancer Mother    Thyroid disease Maternal Aunt    Thyroid disease Maternal Grandmother    Osteoporosis Maternal Grandmother    Skin cancer Maternal Grandmother    Asthma Maternal Uncle    Heart attack Father    Bipolar disorder Brother    Asthma Brother    Heart attack Maternal Grandfather     Allergies  Allergen Reactions   Sulfa Antibiotics Hives and Shortness Of Breath   Dust Mite Extract Hives    Current Outpatient Medications on File Prior to Visit  Medication Sig Dispense Refill   buPROPion (WELLBUTRIN XL) 150 MG 24 hr tablet TAKE 1 TABLET BY MOUTH EVERY DAY 90 tablet 0   etonogestrel (NEXPLANON) 68 MG IMPL implant Nexplanon 68 mg subdermal implant   Inject 1 implant by subcutaneous route.     UBRELVY 100 MG TABS TAKE ONE (1) TABLET BY MOUTH AS NEEDED (MAY REPEAT IN TWO (2) HOURS. MAXIMUM TWO (2) TABLETS IN 24 HOURS.). 16 tablet 0   No current facility-administered medications on file prior to visit.    BP 120/60   Pulse 80   Temp 98.3 F (36.8 C) (Oral)   Ht 5' 5.25" (1.657 m)   Wt 245 lb (111.1 kg)   SpO2 100%   BMI 40.46 kg/m       Objective:   Physical Exam Vitals and nursing note reviewed.  Constitutional:      Appearance: Normal appearance. She is obese.  Cardiovascular:     Rate and Rhythm: Normal rate and regular rhythm.     Pulses: Normal pulses.     Heart sounds: Normal heart sounds.  Pulmonary:     Effort: Pulmonary effort is normal.     Breath sounds: Normal breath sounds.  Skin:    General: Skin is warm and dry.     Findings: Erythema and rash present. Rash is pustular.     Comments: Does have small pustular nodules noted in the left axilla  Neurological:     General: No focal deficit present.     Mental Status: She is oriented to person, place, and time.  Psychiatric:        Mood and Affect: Mood normal.        Behavior: Behavior normal.        Thought Content: Thought content normal.        Judgment: Judgment normal.        Assessment & Plan:  1. Screening examination for STD (sexually transmitted disease)  - Urine cytology ancillary only; Future - HIV Antibody (routine testing w rflx); Future - RPR; Future - RPR - HIV Antibody (routine testing w rflx) - Urine cytology ancillary only  2. Other migraine with status migrainosus, intractable - Will send in  Dorr.  - Ubrogepant (UBRELVY) 100 MG TABS; TAKE ONE (1) TABLET BY MOUTH AS NEEDED (MAY REPEAT IN TWO (2) HOURS. MAXIMUM TWO (2) TABLETS IN 24 HOURS.).  Dispense: 16 tablet; Refill: 3  3. Anxiety with flying  - ALPRAZolam (XANAX) 0.25 MG tablet; Take 1 tablet (0.25 mg total) by mouth 2 (two) times daily as needed for anxiety.   Dispense: 10 tablet; Refill: 0  4. Abscess of left axilla  - fluconazole (DIFLUCAN) 150 MG tablet; Take one tablet  and if needed take second tablet in 3 days  Dispense: 2 tablet; Refill: 0 - doxycycline (VIBRAMYCIN) 100 MG capsule; Take 1 capsule (100 mg total) by mouth 2 (two) times daily.  Dispense: 14 capsule; Refill: 0  Shirline Frees, NP  Time spent with patient today was 41 minutes which consisted of chart review, discussing  STD's, flight anxiety, migraine headaches, and skin infections work up, treatment answering questions and documentation.

## 2022-11-04 LAB — RPR: RPR Ser Ql: NONREACTIVE

## 2022-11-04 LAB — HIV ANTIBODY (ROUTINE TESTING W REFLEX): HIV 1&2 Ab, 4th Generation: NONREACTIVE

## 2022-11-07 LAB — URINE CYTOLOGY ANCILLARY ONLY
Chlamydia: NEGATIVE
Comment: NEGATIVE
Comment: NEGATIVE
Comment: NORMAL
Neisseria Gonorrhea: NEGATIVE
Trichomonas: NEGATIVE

## 2023-03-10 ENCOUNTER — Telehealth: Payer: Self-pay

## 2023-03-10 ENCOUNTER — Telehealth (INDEPENDENT_AMBULATORY_CARE_PROVIDER_SITE_OTHER): Payer: Self-pay | Admitting: Adult Health

## 2023-03-10 ENCOUNTER — Other Ambulatory Visit (HOSPITAL_COMMUNITY): Payer: Self-pay

## 2023-03-10 VITALS — Ht 65.25 in | Wt 247.0 lb

## 2023-03-10 DIAGNOSIS — F40243 Fear of flying: Secondary | ICD-10-CM

## 2023-03-10 MED ORDER — ALPRAZOLAM 0.5 MG PO TABS
0.5000 mg | ORAL_TABLET | Freq: Every day | ORAL | 0 refills | Status: AC | PRN
Start: 2023-03-10 — End: ?

## 2023-03-10 NOTE — Telephone Encounter (Signed)
 Pharmacy Patient Advocate Encounter   Received notification from CoverMyMeds that prior authorization for UBRELVY  is required/requested.   Insurance verification completed.   The patient is insured through [facility] .   Per test claim: PA required; PA submitted to above mentioned insurance via CoverMyMeds Key/confirmation #/EOC Key: [id number]     Status is pending

## 2023-03-10 NOTE — Progress Notes (Signed)
 Virtual Visit via Video Note  I connected with Katie Alvarez on 03/10/23 at  3:00 PM EST by a video enabled telemedicine application and verified that I am speaking with the correct person using two identifiers.  Location patient: home Location provider:work or home office Persons participating in the virtual visit: patient, provider  I discussed the limitations of evaluation and management by telemedicine and the availability of in person appointments. The patient expressed understanding and agreed to proceed.   HPI:  She is being evaluated today for anxiety. She is leaving next week to go on vacation to the [place] and needs a refill of Xanax  that she takes for flying.      ROS: See pertinent positives and negatives per HPI.  Past Medical History:  Diagnosis Date   Band keratopathy of both eyes    Hypertension 11/20/2013   Hypophosphatemia    Migraines    with aura   Mononucleosis    Mononucleosis, infectious, with hepatitis    per patient no hepatitis   STD (sexually transmitted disease) 02/10/2017   chlamydia    Vitamin D  deficiency disease     Past Surgical History:  Procedure Laterality Date   ANTERIOR CRUCIATE LIGAMENT REPAIR Bilateral    2013 & 2015   INTRAUTERINE DEVICE INSERTION  01/2017   Kyleena     liletta iud     inserted 11-23-15   OTHER SURGICAL HISTORY Right 2013   ACL reconstruction times 2   WISDOM TOOTH EXTRACTION      Family History  Problem Relation Age of Onset   Diabetes Mother    Obesity Mother    Asthma Mother    Skin cancer Mother    Thyroid  disease Maternal Aunt    Thyroid  disease Maternal Grandmother    Osteoporosis Maternal Grandmother    Skin cancer Maternal Grandmother    Asthma Maternal Uncle    Heart attack Father    Bipolar disorder Brother    Asthma Brother    Heart attack Maternal Grandfather        Current Outpatient Medications:    ALPRAZolam  (XANAX ) 0.25 MG tablet, Take 1 tablet (0.25 mg total) by mouth 2 (two)  times daily as needed for anxiety., Disp: 10 tablet, Rfl: 0   buPROPion  (WELLBUTRIN  XL) 150 MG 24 hr tablet, TAKE 1 TABLET BY MOUTH EVERY DAY, Disp: 90 tablet, Rfl: 0   etonogestrel (NEXPLANON) 68 MG IMPL implant, Nexplanon 68 mg subdermal implant  Inject 1 implant by subcutaneous route., Disp: , Rfl:    fluconazole  (DIFLUCAN ) 150 MG tablet, Take one tablet and if needed take second tablet in 3 days, Disp: 2 tablet, Rfl: 0   Ubrogepant  (UBRELVY ) 100 MG TABS, TAKE ONE (1) TABLET BY MOUTH AS NEEDED (MAY REPEAT IN TWO (2) HOURS. MAXIMUM TWO (2) TABLETS IN 24 HOURS.)., Disp: 16 tablet, Rfl: 3  EXAM:  VITALS per patient if applicable:  GENERAL: alert, oriented, appears well and in no acute distress  HEENT: atraumatic, conjunttiva clear, no obvious abnormalities on inspection of external nose and ears  NECK: normal movements of the head and neck  LUNGS: on inspection no signs of respiratory distress, breathing rate appears normal, no obvious gross SOB, gasping or wheezing  CV: no obvious cyanosis  MS: moves all visible extremities without noticeable abnormality  PSYCH/NEURO: pleasant and cooperative, no obvious depression or anxiety, speech and thought processing grossly intact  ASSESSMENT AND PLAN:  Discussed the following assessment and plan:  1. Anxiety with flying (Primary)  -  ALPRAZolam  (XANAX ) 0.5 MG tablet; Take 1 tablet (0.5 mg total) by mouth daily as needed for anxiety. Take prior to flight  Dispense: 15 tablet; Refill: 0      I discussed the assessment and treatment plan with the patient. The patient was provided an opportunity to ask questions and all were answered. The patient agreed with the plan and demonstrated an understanding of the instructions.   The patient was advised to call back or seek an in-person evaluation if the symptoms worsen or if the condition fails to improve as anticipated.   Katie Hardgrave, NP

## 2023-03-13 ENCOUNTER — Other Ambulatory Visit (HOSPITAL_COMMUNITY): Payer: Self-pay

## 2023-03-13 NOTE — Telephone Encounter (Signed)
 Pharmacy Patient Advocate Encounter  Received notification from [facility] that Prior Authorization for UBRELVY  has been APPROVED from 2.7.25 to 2.6.26. Ran test claim, Copay is $RTS AND WAS LAST FILLED ON 03/11/23. This test claim was processed through [facility]- copay amounts may vary at other pharmacies due to pharmacy/plan contracts, or as the patient moves through the different stages of their insurance plan.   PA #/Case ID/Reference #: (Key: [id number])

## 2024-01-21 ENCOUNTER — Other Ambulatory Visit: Payer: Self-pay | Admitting: Adult Health

## 2024-01-21 DIAGNOSIS — G43811 Other migraine, intractable, with status migrainosus: Secondary | ICD-10-CM

## 2024-01-23 ENCOUNTER — Other Ambulatory Visit: Payer: Self-pay | Admitting: Adult Health

## 2024-01-23 DIAGNOSIS — G43811 Other migraine, intractable, with status migrainosus: Secondary | ICD-10-CM

## 2024-01-23 MED ORDER — UBRELVY 100 MG PO TABS: ORAL_TABLET | ORAL | 3 refills | Status: DC

## 2024-01-23 MED ORDER — UBRELVY 100 MG PO TABS: ORAL_TABLET | ORAL | 3 refills | Status: AC

## 2024-02-21 ENCOUNTER — Telehealth: Payer: Self-pay

## 2024-02-21 ENCOUNTER — Other Ambulatory Visit (HOSPITAL_COMMUNITY): Payer: Self-pay

## 2024-02-21 NOTE — Telephone Encounter (Signed)
 Pharmacy Patient Advocate Encounter   Received notification from [facility] KEY that prior authorization for Ubrelvy  100 is required/requested.   Insurance verification completed.   The patient is insured through [facility].   Per test claim: PA required; PA submitted to above mentioned insurance via Latent Key/confirmation #/EOC [id number] Status is pending
